# Patient Record
Sex: Male | Born: 1970 | Race: White | Hispanic: No | Smoking: Current every day smoker
Health system: Southern US, Community
[De-identification: ages and names within clinical notes are randomized; demographics above are authoritative.]

## PROBLEM LIST (undated history)

## (undated) DIAGNOSIS — T8859XA Other complications of anesthesia, initial encounter: Secondary | ICD-10-CM

## (undated) DIAGNOSIS — J189 Pneumonia, unspecified organism: Secondary | ICD-10-CM

## (undated) DIAGNOSIS — K760 Fatty (change of) liver, not elsewhere classified: Secondary | ICD-10-CM

## (undated) DIAGNOSIS — Z87442 Personal history of urinary calculi: Secondary | ICD-10-CM

## (undated) DIAGNOSIS — B20 Human immunodeficiency virus [HIV] disease: Secondary | ICD-10-CM

## (undated) DIAGNOSIS — Z21 Asymptomatic human immunodeficiency virus [HIV] infection status: Secondary | ICD-10-CM

## (undated) DIAGNOSIS — F419 Anxiety disorder, unspecified: Secondary | ICD-10-CM

## (undated) DIAGNOSIS — K219 Gastro-esophageal reflux disease without esophagitis: Secondary | ICD-10-CM

---

## 2002-01-29 ENCOUNTER — Encounter: Payer: Self-pay | Admitting: Emergency Medicine

## 2002-01-29 ENCOUNTER — Emergency Department (HOSPITAL_COMMUNITY): Admission: EM | Admit: 2002-01-29 | Discharge: 2002-01-29 | Payer: Self-pay | Admitting: Emergency Medicine

## 2004-04-04 ENCOUNTER — Emergency Department (HOSPITAL_COMMUNITY): Admission: EM | Admit: 2004-04-04 | Discharge: 2004-04-05 | Payer: Self-pay | Admitting: Emergency Medicine

## 2004-08-25 ENCOUNTER — Ambulatory Visit: Payer: Self-pay | Admitting: Internal Medicine

## 2004-08-29 ENCOUNTER — Ambulatory Visit: Payer: Self-pay | Admitting: *Deleted

## 2005-09-25 ENCOUNTER — Emergency Department (HOSPITAL_COMMUNITY): Admission: EM | Admit: 2005-09-25 | Discharge: 2005-09-25 | Payer: Self-pay | Admitting: Emergency Medicine

## 2005-09-27 ENCOUNTER — Ambulatory Visit: Payer: Self-pay | Admitting: Family Medicine

## 2005-10-04 ENCOUNTER — Ambulatory Visit: Payer: Self-pay | Admitting: Internal Medicine

## 2005-10-15 ENCOUNTER — Ambulatory Visit: Payer: Self-pay | Admitting: Internal Medicine

## 2006-01-28 ENCOUNTER — Ambulatory Visit: Payer: Self-pay | Admitting: Family Medicine

## 2006-02-14 ENCOUNTER — Ambulatory Visit: Payer: Self-pay | Admitting: Internal Medicine

## 2006-02-25 ENCOUNTER — Ambulatory Visit: Payer: Self-pay | Admitting: Internal Medicine

## 2006-10-01 ENCOUNTER — Emergency Department (HOSPITAL_COMMUNITY): Admission: EM | Admit: 2006-10-01 | Discharge: 2006-10-01 | Payer: Self-pay | Admitting: Emergency Medicine

## 2006-10-17 ENCOUNTER — Ambulatory Visit: Payer: Self-pay | Admitting: Internal Medicine

## 2006-11-07 ENCOUNTER — Ambulatory Visit: Payer: Self-pay | Admitting: Internal Medicine

## 2006-11-15 ENCOUNTER — Emergency Department (HOSPITAL_COMMUNITY): Admission: EM | Admit: 2006-11-15 | Discharge: 2006-11-15 | Payer: Self-pay | Admitting: Emergency Medicine

## 2007-11-15 ENCOUNTER — Emergency Department (HOSPITAL_COMMUNITY): Admission: EM | Admit: 2007-11-15 | Discharge: 2007-11-15 | Payer: Self-pay | Admitting: Emergency Medicine

## 2008-12-28 ENCOUNTER — Emergency Department (HOSPITAL_COMMUNITY): Admission: EM | Admit: 2008-12-28 | Discharge: 2008-12-28 | Payer: Self-pay | Admitting: Emergency Medicine

## 2009-10-13 ENCOUNTER — Emergency Department (HOSPITAL_COMMUNITY): Admission: EM | Admit: 2009-10-13 | Discharge: 2009-10-13 | Payer: Self-pay | Admitting: Emergency Medicine

## 2010-02-06 ENCOUNTER — Emergency Department (HOSPITAL_COMMUNITY): Admission: EM | Admit: 2010-02-06 | Discharge: 2010-02-07 | Payer: Self-pay | Admitting: Emergency Medicine

## 2010-02-08 ENCOUNTER — Ambulatory Visit: Payer: Self-pay | Admitting: Internal Medicine

## 2010-02-08 ENCOUNTER — Encounter: Payer: Self-pay | Admitting: Internal Medicine

## 2010-02-08 LAB — CONVERTED CEMR LAB
ALT: 9 units/L (ref 0–53)
AST: 13 units/L (ref 0–37)
Absolute CD4: 370 #/uL — ABNORMAL LOW (ref 381–1469)
Albumin: 4.2 g/dL (ref 3.5–5.2)
Alkaline Phosphatase: 87 units/L (ref 39–117)
BUN: 7 mg/dL (ref 6–23)
Basophils Absolute: 0 10*3/uL (ref 0.0–0.1)
Basophils Relative: 0 % (ref 0–1)
CD4 T Helper %: 22 % — ABNORMAL LOW (ref 32–62)
CO2: 25 meq/L (ref 19–32)
Calcium: 9.6 mg/dL (ref 8.4–10.5)
Chlamydia, Swab/Urine, PCR: NEGATIVE
Chloride: 103 meq/L (ref 96–112)
Cholesterol: 125 mg/dL (ref 0–200)
Creatinine, Ser: 0.81 mg/dL (ref 0.40–1.50)
Eosinophils Absolute: 0.1 10*3/uL (ref 0.0–0.7)
Eosinophils Relative: 2 % (ref 0–5)
GC Probe Amp, Urine: NEGATIVE
Glucose, Bld: 82 mg/dL (ref 70–99)
HCT: 46.8 % (ref 39.0–52.0)
HDL: 29 mg/dL — ABNORMAL LOW (ref >39)
HIV 1 RNA Quant: 13500 copies/mL — ABNORMAL HIGH (ref <48)
HIV-1 RNA Quant, Log: 4.13 — ABNORMAL HIGH (ref <1.68)
Hemoglobin: 15.7 g/dL (ref 13.0–17.0)
Hgb A1c MFr Bld: 5.6 % (ref 4.6–6.1)
LDL Cholesterol: 81 mg/dL (ref 0–99)
Lymphocytes Relative: 28 % (ref 12–46)
Lymphs Abs: 1.7 10*3/uL (ref 0.7–4.0)
MCHC: 33.5 g/dL (ref 30.0–36.0)
MCV: 91.1 fL (ref 78.0–100.0)
Monocytes Absolute: 0.4 10*3/uL (ref 0.1–1.0)
Monocytes Relative: 7 % (ref 3–12)
Neutro Abs: 3.7 10*3/uL (ref 1.7–7.7)
Neutrophils Relative %: 62 % (ref 43–77)
Platelets: 135 10*3/uL — ABNORMAL LOW (ref 150–400)
Potassium: 4.5 meq/L (ref 3.5–5.3)
RBC: 5.14 M/uL (ref 4.22–5.81)
RDW: 13.6 % (ref 11.5–15.5)
Sodium: 138 meq/L (ref 135–145)
Total Bilirubin: 0.4 mg/dL (ref 0.3–1.2)
Total CHOL/HDL Ratio: 4.3
Total Protein: 8.4 g/dL — ABNORMAL HIGH (ref 6.0–8.3)
Total lymphocyte count: 1680 cells/mcL (ref 700–3300)
Triglycerides: 77 mg/dL (ref <150)
VLDL: 15 mg/dL (ref 0–40)
WBC: 6 10*3/uL (ref 4.0–10.5)

## 2010-02-09 ENCOUNTER — Encounter: Payer: Self-pay | Admitting: Internal Medicine

## 2010-02-09 ENCOUNTER — Ambulatory Visit: Payer: Self-pay | Admitting: Internal Medicine

## 2010-02-09 DIAGNOSIS — L301 Dyshidrosis [pompholyx]: Secondary | ICD-10-CM

## 2010-02-09 DIAGNOSIS — R21 Rash and other nonspecific skin eruption: Secondary | ICD-10-CM

## 2010-02-09 DIAGNOSIS — B20 Human immunodeficiency virus [HIV] disease: Secondary | ICD-10-CM | POA: Insufficient documentation

## 2010-02-23 ENCOUNTER — Encounter: Payer: Self-pay | Admitting: Internal Medicine

## 2010-02-23 ENCOUNTER — Ambulatory Visit: Payer: Self-pay | Admitting: Internal Medicine

## 2010-02-23 DIAGNOSIS — F17200 Nicotine dependence, unspecified, uncomplicated: Secondary | ICD-10-CM | POA: Insufficient documentation

## 2010-05-23 ENCOUNTER — Encounter: Payer: Self-pay | Admitting: Internal Medicine

## 2010-09-05 ENCOUNTER — Ambulatory Visit: Payer: Self-pay | Admitting: Internal Medicine

## 2010-09-05 LAB — CONVERTED CEMR LAB
ALT: 9 units/L (ref 0–53)
AST: 17 units/L (ref 0–37)
Albumin: 3.9 g/dL (ref 3.5–5.2)
Alkaline Phosphatase: 85 units/L (ref 39–117)
BUN: 7 mg/dL (ref 6–23)
Basophils Absolute: 0 10*3/uL (ref 0.0–0.1)
Basophils Relative: 0 % (ref 0–1)
CO2: 28 meq/L (ref 19–32)
Calcium: 9 mg/dL (ref 8.4–10.5)
Chloride: 102 meq/L (ref 96–112)
Creatinine, Ser: 0.88 mg/dL (ref 0.40–1.50)
Eosinophils Absolute: 0.2 10*3/uL (ref 0.0–0.7)
Eosinophils Relative: 2 % (ref 0–5)
Glucose, Bld: 133 mg/dL — ABNORMAL HIGH (ref 70–99)
HCT: 45.9 % (ref 39.0–52.0)
HIV 1 RNA Quant: 36300 copies/mL — ABNORMAL HIGH (ref <20)
HIV-1 RNA Quant, Log: 4.56 — ABNORMAL HIGH (ref <1.30)
Hemoglobin: 14.9 g/dL (ref 13.0–17.0)
Lymphocytes Relative: 22 % (ref 12–46)
Lymphs Abs: 1.8 10*3/uL (ref 0.7–4.0)
MCHC: 32.5 g/dL (ref 30.0–36.0)
MCV: 94.3 fL (ref 78.0–100.0)
Monocytes Absolute: 0.4 10*3/uL (ref 0.1–1.0)
Monocytes Relative: 5 % (ref 3–12)
Neutro Abs: 5.6 10*3/uL (ref 1.7–7.7)
Neutrophils Relative %: 70 % (ref 43–77)
Platelets: 193 10*3/uL (ref 150–400)
Potassium: 4.1 meq/L (ref 3.5–5.3)
RBC: 4.87 M/uL (ref 4.22–5.81)
RDW: 15.1 % (ref 11.5–15.5)
Sodium: 137 meq/L (ref 135–145)
Total Bilirubin: 0.6 mg/dL (ref 0.3–1.2)
Total Protein: 8 g/dL (ref 6.0–8.3)
WBC: 8 10*3/uL (ref 4.0–10.5)

## 2010-09-18 ENCOUNTER — Ambulatory Visit: Payer: Self-pay | Admitting: Internal Medicine

## 2010-09-18 DIAGNOSIS — J029 Acute pharyngitis, unspecified: Secondary | ICD-10-CM | POA: Insufficient documentation

## 2010-09-18 LAB — CONVERTED CEMR LAB

## 2010-09-19 ENCOUNTER — Telehealth (INDEPENDENT_AMBULATORY_CARE_PROVIDER_SITE_OTHER): Payer: Self-pay | Admitting: *Deleted

## 2010-09-25 ENCOUNTER — Encounter: Payer: Self-pay | Admitting: Internal Medicine

## 2010-10-08 ENCOUNTER — Emergency Department (HOSPITAL_COMMUNITY): Admission: EM | Admit: 2010-10-08 | Discharge: 2010-10-08 | Payer: Self-pay | Admitting: Emergency Medicine

## 2010-10-09 ENCOUNTER — Ambulatory Visit: Payer: Self-pay | Admitting: Internal Medicine

## 2010-10-27 ENCOUNTER — Telehealth (INDEPENDENT_AMBULATORY_CARE_PROVIDER_SITE_OTHER): Payer: Self-pay | Admitting: *Deleted

## 2010-11-20 ENCOUNTER — Encounter (INDEPENDENT_AMBULATORY_CARE_PROVIDER_SITE_OTHER): Payer: Self-pay | Admitting: *Deleted

## 2011-01-08 ENCOUNTER — Encounter: Payer: Self-pay | Admitting: Internal Medicine

## 2011-01-23 NOTE — Progress Notes (Signed)
Summary: unable to pick up RX  Phone Note Call from Patient   Caller: Patient Summary of Call: Pt. called he is unable to pick up the Erythromycin because it is not on the $4.00 list at [facility].  He said he is feeling better and will call if he needs something else called in. Initial call taken by: Wendall Mola CMA Duncan Dull),  September 19, 2010 12:21 PM

## 2011-01-23 NOTE — Miscellaneous (Signed)
Summary: RW Financial Update Timothy Salas)  Clinical Lists Changes  Observations: Added new observation of RWTITLE: B (11/20/2010 16:04) Added new observation of AIDSDAP: Yes 2011 (11/20/2010 16:04) Added new observation of PCTFPL: 0  (11/20/2010 16:04) Added new observation of INCOMESOURCE: None  (11/20/2010 16:04) Added new observation of HOUSEINCOME: 0  (11/20/2010 16:04) Added new observation of FAMILYSIZE: 1  (11/20/2010 16:04) Added new observation of HOUSING: Stable/permanent  (11/20/2010 16:04) Added new observation of FINASSESSDT: 09/18/2010  (11/20/2010 16:04) Added new observation of YEARLYEXPEN: 0  (11/20/2010 16:04)

## 2011-01-23 NOTE — Miscellaneous (Signed)
Summary: intake  Clinical Lists Changes  Orders: Added new Service order of Influenza Vaccine NON MCR (16109) - Signed Observations: Added new observation of #CHILD<18 IN: No (02/08/2010 10:29) Added new observation of PAYOR: No Insurance (02/08/2010 10:29) Added new observation of AIDSDAP: Pending (02/08/2010 10:29) Added new observation of DATE1STVISIT: 02/09/2010 (02/08/2010 10:29) Added new observation of INFECTDIS MD: Philipp Deputy (02/08/2010 10:29) Added new observation of HIV INIT DX: 1988  (02/08/2010 10:29) Added new observation of PNEUMOVAX: Pneumovax  (02/08/2010 10:29) Added new observation of FLU VAX#1VIS: 07/17/07 version given February 09, 2010.  (02/08/2010 10:29) Added new observation of FLU VAXLOT: [id number]  (02/08/2010 10:29) Added new observation of FLU VAX EXP: 06/22/2010  (02/08/2010 10:29) Added new observation of FLU VAXBY: Sharen Heck RN  (02/08/2010 10:29) Added new observation of FLU VAXRTE: IM  (02/08/2010 10:29) Added new observation of FLU VAX DSE: 0.5 ml  (02/08/2010 10:29) Added new observation of FLU VAXMFR: [employer] P[employer]  (02/08/2010 10:29) Added new observation of FLU VAX SITE: right deltoid  (02/08/2010 10:29) Added new observation of FLU VAX: Fluvax Non-MCR  (02/08/2010 10:29) Added new observation of DRUG USE: Yes  (02/08/2010 10:29) Added new observation of ALCOHOL USE: occasional /day (02/08/2010 10:29) Added new observation of ALCOHOL COMM: Yes  (02/08/2010 10:29) Added new observation of HIV RISK BEH: MSM  (02/08/2010 10:29) Added new observation of GENDER: Male  (02/08/2010 10:29) Added new observation of MARITAL STAT: Single  (02/08/2010 10:29) Added new observation of LATINO/HISP: Yes  (02/08/2010 10:29) Added new observation of RACE: White  (02/08/2010 10:29) Added new observation of REC_MESSAGE: Yes  (02/08/2010 10:29) Added new observation of RECPHONECALL: Yes  (02/08/2010 10:29) Added new observation of RW VITAL STA: Active  (02/08/2010  10:29) Added new observation of PATNTCOUNTY: [place]  (02/08/2010 10:29) Added new observation of RWPARTICIP: Yes - [facility]  (02/08/2010 10:29)      Orders Added: 1)  Influenza Vaccine NON MCR [00028]      Infectious Disease New Patient Intake  Health Insurance / Payor: No Insurance Employer: [employer]   Does insurance cover prescriptions? No Our patient has been informed that medication assistance programs are available.  Our Co-ordinator will be meeting with the patient during this visit to discuss financial and medication assistance.   Do you have a Primary physician: No Physician Name: Dr.Zlaty Alexa   City/State: [place] Dates of Service To:   05/16/2003 Are family members aware of patient's diagnosis?  If so, are they supportive? yes, lives with grandmother     Same partner for 8 years Describe patient's current social support (family, friends, support groups): Reports everyone knows he is HIV positive.  Medical History Medical Problems OTHER than HIV: No   Medication Allergies: Yes   Medications: No    Family History Heart Disease: Yes  Family Side: Paternal  Comments: father died MI May 15, 2002; p-grandmother cabgx3 Hypertension: No Diabetes: Yes  Family Side: Maternal, Paternal  Comments: both grandmothers and father High Cholesterol: No  Comments: father Kidney Disease: No Cancer: No Thyroid Disease: No   Behavioral Health Assessment Have you ever been diagnosed with depression or mental illness? Yes  Diagnosis: depression Do you drink alcohol? Yes Last Date of Consumption: 02/08/2010 Frequency: occasional Alcohol Beverage Type(s): wine or rum Do you use recreational drugs? Yes Drugs: daily MJ use, smokes 2-4 joints/day Do you feel you have a problem with drugs and/or alcohol? No   Have you ever been in a treatment facility for any addiction? No If you are currently using drugs  and/or alcohol, would you like help for this addiction? No Behavioral Health Comments: hx  overdose as a teen  HIV Intake Information When did you first test positive for HIV? 1988 Where was this test performed?  Name of Agency: [place]  City/State: [place] [facility]: [place] Was this your first time ever being tested or HIV? Yes Risk Factor(s) for HIV: MSM  Method of Exposure to HIV: Transfusion   Have you ever been hospitalized for any HIV-related condition? No  Have you ever been under the care of a physician for being HIV positive? Yes Name of Physician 1: Dr. Philipp Deputy  City/State: [place], [place]  HIV Medications Information The patient is currently NOT taking any HIV medications.  Nucleoside Reverse Transcriptase Inhibitors (NRTI's) Combivir (Lamivadin 150mg /Zidovudine 300mg ): Yes    Sexual History Are you in a current relationship? Yes How long have you been in this relationship? 8 years Are they aware of your diagnosis? Yes Have they been tested for HIV? Yes What were the results: Negative Are you currently sexually active? Yes  Evaluation and Follow-Up  ADAP: Pending   Pneumovax Immunization History:    Pneumovax # 1:  Pneumovax (02/08/2010)  Influenza Vaccine    Vaccine Type: Fluvax Non-MCR    Site: right deltoid    Mfr: A[employer]is Pasteur    Dose: 0.5 ml    Route: IM    Given by: Sharen Heck RN    Exp. Date: 06/22/2010    Lot #: [id number]    VIS given: 07/17/07 version given February 09, 2010.  Flu Vaccine Consent Questions    Do you have a history of severe allergic reactions to this vaccine? no    Any prior history of allergic reactions to egg and/or gelatin? no    Do you have a sensitivity to the preservative Thimersol? no    Do you have a past history of Guillan-Barre Syndrome? no    Do you currently have an acute febrile illness? no    Have you ever had a severe reaction to latex? no    Vaccine information given and explained to patient? yes Flu and pneumovax given 02/08/10.  Sharen Heck RN Pneumovax 0.5 ml given at  left deltoid. Lot #[id number] exp. 28 Jan.12/[employer]

## 2011-01-23 NOTE — Miscellaneous (Signed)
Summary: RW Update  Clinical Lists Changes  Observations: Added new observation of DATE1STVISIT: 09/18/2010 (09/25/2010 16:09) Added new observation of RWPARTICIP: Yes (09/25/2010 16:09)

## 2011-01-23 NOTE — Miscellaneous (Signed)
Summary: Office Visit ([facility])    Vital Signs:  Patient profile:   40 year old male Height:      65 inches Weight:      132 pounds Temp:     97.9 degrees F oral Pulse rate:   90 / minute Pulse rhythm:   regular Resp:     17 per minute BP sitting:   126 / 72  (left arm)  Vitals Entered By: Sharen Heck RN (February 09, 2010 9:31 AM) CC: 05 pateint c/o rash on facewhen shaving/no care forseveral years Is Patient Diabetic? No  Does patient need assistance? Functional Status Self care Ambulation Normal   CC:  05 pateint c/o rash on facewhen shaving/no care forseveral years.  History of Present Illness: Pt has been out of care for greater than 3 years.  He c/o rash on face where his beard is. Needs a note for work to state that he should not shave frequently because it makes his rash worse. He also has a puritic rash on his hands.   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 Take 1 Tablet By Mouth Two Times A Day 2)  Triamcinolone Acetonide 0.1 % Crea (Triamcinolone Acetonide) .... Apply Two Times A Day 3)  Fluconazole 100 Mg Tabs (Fluconazole) .... Take 1 Tablet By Mouth Once A Day  Allergies (verified): 1)  ! Amoxicillin  Past History:  Past Medical History: HIV disease   Review of Systems  The patient denies anorexia, fever, and weight loss.     Physical Exam  General:  alert,  well-developed, well-nourished, and well-hydrated.   Head:  normocephalic and atraumatic.   folliculits on his face in area of his beard Mouth:  pharynx pink and moist.   Lungs:  normal breath sounds.     Impression & Recommendations:  Problem # 1:  FACIAL RASH (ICD-782.1) will treat with doxy letter written His updated medication list for this problem includes:    Triamcinolone Acetonide 0.1 % Crea (Triamcinolone acetonide) .Marland Kitchen... Apply two times a day  Problem # 2:  DYSHIDROTIC ECZEMA, HANDS (ICD-705.81) triamcinolone cream  Problem # 3:  HIV DISEASE (ICD-042) Currently not on therapy.  Labs pending. Will have pt return in 2 weeks. Orders: Est. Patient Level III (16109)  Medications Added to Medication List This Visit: 1)  Doxycycline Hyclate 100 Mg Caps (Doxycycline hyclate) .... Take 1 tablet by mouth two times a day 2)  Triamcinolone Acetonide 0.1 % Crea (Triamcinolone acetonide) .... Apply two times a day 3)  Fluconazole 100 Mg Tabs (Fluconazole) .... Take 1 tablet by mouth once a day   Patient Instructions: 1)  Please schedule a follow-up appointment in 2 weeks. Prescriptions: FLUCONAZOLE 100 MG TABS (FLUCONAZOLE) Take 1 tablet by mouth once a day  #10 x 0   Entered and Authorized by:  Yisroel Ramming MD   Signed by:   Yisroel Ramming MD on 02/09/2010   Method used:   Print then Give to Patient   RxID:   [id number] TRIAMCINOLONE ACETONIDE 0.1 % CREA (TRIAMCINOLONE ACETONIDE) apply two times a day  #30gm x 3   Entered and Authorized by:   Yisroel Ramming MD   Signed by:   Yisroel Ramming MD on 02/09/2010   Method used:   Print then Give to Patient   RxID:   [id number] DOXYCYCLINE HYCLATE 100 MG CAPS (DOXYCYCLINE HYCLATE) Take 1 tablet by mouth two times a day  #20 x 0   Entered and Authorized by:   Yisroel Ramming MD   Signed by:   Yisroel Ramming MD on 02/09/2010   Method used:   Print then Give to Patient   RxID:   [id number]

## 2011-01-23 NOTE — Miscellaneous (Signed)
Summary:  Lab orders  Clinical Lists Changes  Orders: Added new Test order of T-CBC w/Diff [phone]) - Signed Added new Test order of T-CD4SP [facility]) (CD4SP) - Signed Added new Test order of T-Comprehensive Metabolic Panel [id number]) - Signed Added new Test order of T-HIV Viral Load [phone]) - Signed     Process Orders Check Orders Results:     [facility]: ABN not required for this insurance Tests Sent for requisitioning (May 23, 2010 2:26 PM):     05/24/2010: [facility] -- T-CBC w/Diff [[id number]] (signed)     05/24/2010: [facility] -- T-Comprehensive Metabolic Panel [[id number]] (signed)     05/24/2010: [facility] -- T-HIV Viral Load [phone] (signed)

## 2011-01-23 NOTE — Miscellaneous (Signed)
Summary: Office Visit ([facility])    Vital Signs:  Patient profile:   40 year old male Height:      65 inches Weight:      132 pounds BMI:     22.05 Temp:     97.7 degrees F oral Pulse rate:   84 / minute Pulse rhythm:   regular Resp:     20 per minute BP sitting:   116 / 71  (left arm) Cuff size:   regular  Vitals Entered By: Michelle Nasuti (February 23, 2010 9:35 AM) CC: 05 follow up review lab results. pt is interested in smoking cessation Is Patient Diabetic? No Pain Assessment Patient in pain? no       Does patient need assistance? Functional Status Self care Ambulation Normal   CC:  05 follow up review lab results. pt is interested in smoking cessation.  History of Present Illness: Pt states that he feels better.  He is here for lab results.  He would like to quit smoking.  He has been smoking since he was 11.  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 Apply Two Times A Day 2)  Chantix Starting Month Pak 0.5 Mg X 11 & 1 Mg X 42 Tabs (Varenicline Tartrate) .... Take As Directed  Allergies: 1)  ! Amoxicillin   Review of Systems  The patient denies anorexia, fever, and weight loss.     Physical Exam  General:  alert, well-developed, well-nourished, and well-hydrated.   Head:  normocephalic and atraumatic.   Mouth:  pharynx pink and moist.  no thrush  Lungs:  normal breath sounds.     Impression & Recommendations:  Problem # 1:  HIV DISEASE (ICD-042) His CD4ct was 370 and VL 13,500. Pt does not wish to be on meds at this time.  He will return in 3 months for repeat labs. Diagnostics Reviewed:  WBC: 6.0 (02/08/2010)   Hgb: 15.7 (02/08/2010)   HCT:  46.8 (02/08/2010)   Platelets: 135 (02/08/2010) HIV genotype: See Comment (02/08/2010)   HIV-1 RNA: 13500 (02/08/2010)     Problem # 2:  TOBACCO USER (ICD-305.1) He wishes to quit.  Will try with chantix.  Potential side effects were discussed. His updated medication list for this problem includes:    Chantix Starting Month Pak 0.5 Mg X 11 & 1 Mg X 42 Tabs (Varenicline tartrate) .Marland Kitchen... Take as directed  Medications Added to Medication List This Visit: 1)  Chantix Starting Month Pak 0.5 Mg X 11 & 1 Mg X 42 Tabs (Varenicline tartrate) .... Take as directed   Patient Instructions: 1)  Please schedule a follow-up appointment in 3 months. Prescriptions: CHANTIX STARTING MONTH PAK 0.5 MG X 11 & 1 MG X 42 TABS (VARENICLINE TARTRATE) take as directed  #1 pack x 0   Entered and Authorized by:   Yisroel Ramming MD   Signed by:   Yisroel Ramming MD on 02/23/2010   Method used:   Print then Give to Patient   RxID:   [id number]

## 2011-01-23 NOTE — Progress Notes (Signed)
Summary: [facility] rxes arrived  Phone Note Outgoing Call   Call placed by: Jennet Maduro RN,  October 27, 2010 2:17 PM Call placed to: Patient Action Taken: Assistance medications ready for pick up Summary of Call: [facility] rxes have arrived.  [place].   Spoke with the pt. Jennet Maduro RN  October 27, 2010 2:19 PM

## 2011-01-23 NOTE — Progress Notes (Signed)
Summary: Chantix pt. assistance application  Phone Note Outgoing Call   Call placed by: Annice Pih Summary of Call: [employer] to care application mailed with RX and Chief Financial Officer. Initial call taken by: Wendall Mola CMA Duncan Dull),  October 27, 2010 4:29 PM

## 2011-01-23 NOTE — Assessment & Plan Note (Signed)
Summary: f/u [mkj]   CC:  follow-up visit, lab results, c/o headache, sorethroat, chills, and night sweats x 3 days.  History of Present Illness: Pt c/o 4 days of sore throat, night sweats and chills.  He also c/o a HA.  No stiff neck or cough.  He never got his chantix filled and is till smoking so would like to get it.  Preventive Screening-Counseling & Management  Alcohol-Tobacco     Alcohol drinks/day: <1     Smoking Status: current     Packs/Day: 1.0     Year Started: 1987  Caffeine-Diet-Exercise     Caffeine use/day: 6 glasses     Caffeine Counseling: decrease use of caffeine     Does Patient Exercise: yes     Type of exercise: plays with dogs     Exercise (avg: min/session): <30     Times/week: 3  Safety-Violence-Falls     Seat Belt Use: yes      Sexual History:  currently monogamous.        Drug Use:  current, marijuana, and Yes.    Comments: pt. declined condoms   Updated Prior Medication List: TRIAMCINOLONE ACETONIDE 0.1 % CREA (TRIAMCINOLONE ACETONIDE) apply two times a day CHANTIX STARTING MONTH PAK 0.5 MG X 11 & 1 MG X 42 TABS (VARENICLINE TARTRATE) take as directed ERY-TAB 333 MG TBEC (ERYTHROMYCIN BASE) Take 1 tablet by mouth three times a day  Current Allergies (reviewed today): ! AMOXICILLIN Past History:  Past Medical History: Last updated: 02/09/2010 HIV disease  Social History: Drug Use:  current, marijuana, Yes Sexual History:  currently monogamous  Review of Systems  The patient denies anorexia, weight loss, prolonged cough, and hemoptysis.    Vital Signs:  Patient profile:   40 year old male Height:      65 inches (165.10 cm) Weight:      130.8 pounds (59.45 kg) BMI:     21.84 Temp:     97.9 degrees F (36.61 degrees C) oral Pulse rate:   69 / minute BP sitting:   123 / 82  (right arm)  Vitals Entered By: Wendall Mola CMA Duncan Dull) (September 18, 2010 10:12 AM) CC: follow-up visit, lab results, c/o headache, sorethroat,  chills, night sweats x 3 days Is Patient Diabetic? No Pain Assessment Patient in pain? yes     Location: head Intensity: 4 Type: aching Onset of pain  Constant Nutritional Status BMI of 19 -24 = normal Nutritional Status Detail appetite "good"  Does patient need assistance? Functional Status Self care Ambulation Normal   Physical Exam  General:  alert, well-developed, well-nourished, and well-hydrated.   Head:  normocephalic and atraumatic.   Mouth:  pharynx pink and moist, no erythema, and no exudates.   Neck:  supple.   Lungs:  normal breath sounds.     Impression & Recommendations:  Problem # 1:  HIV DISEASE (ICD-042) Will obtain a genotype and have pt f/u in 3 weeks. He is considering starting therapy. Orders: T-HIV Genotype ([id number]) Est. Patient Level III (09811)  Diagnostics Reviewed:  CD4: 330 (09/06/2010)   WBC: 8.0 (09/05/2010)   Hgb: 14.9 (09/05/2010)   HCT: 45.9 (09/05/2010)   Platelets: 193 (09/05/2010) HIV genotype: See Comment (02/08/2010)   HIV-1 RNA: 36300 (09/05/2010)     Problem # 2:  TOBACCO USER (ICD-305.1) will try chantix His updated medication list for this problem includes:    Chantix Starting Month Pak 0.5 Mg X 11 & 1 Mg X 42 Tabs (  Varenicline tartrate) .Marland Kitchen... Take as directed  Problem # 3:  PHARYNGITIS (ICD-462)  His updated medication list for this problem includes:    Ery-tab 333 Mg Tbec (Erythromycin base) .Marland Kitchen... Take 1 tablet by mouth three times a day  Medications Added to Medication List This Visit: 1)  Ery-tab 333 Mg Tbec (Erythromycin base) .... Take 1 tablet by mouth three times a day  Patient Instructions: 1)  Please schedule a follow-up appointment in 3 weeks. Prescriptions: ERY-TAB 333 MG TBEC (ERYTHROMYCIN BASE) Take 1 tablet by mouth three times a day  #30 x 0   Entered and Authorized by:   Yisroel Ramming MD   Signed by:   Yisroel Ramming MD on 09/18/2010   Method used:   Print then Give to Patient   RxID:    [id number]   Appended Document: Orders Update    Clinical Lists Changes  Orders: Added new Service order of Influenza Vaccine NON MCR (14782) - Signed Observations: Added new observation of FLU VAX#1VIS: 07/18/10 version given September 18, 2010. (09/18/2010 10:56) Added new observation of FLU VAXLOT: 1103 3P (09/18/2010 10:56) Added new observation of FLU VAX EXP: 03/25/2011 (09/18/2010 10:56) Added new observation of FLU VAXBY: Wendall Mola CMA ( AAMA) (09/18/2010 10:56) Added new observation of FLU VAXRTE: IM (09/18/2010 10:56) Added new observation of FLU VAX DSE: 0.5 ml (09/18/2010 10:56) Added new observation of FLU VAXMFR: [employer]is (09/18/2010 10:56) Added new observation of FLU VAX SITE: right deltoid (09/18/2010 10:56) Added new observation of FLU VAX: Fluvax Non-MCR (09/18/2010 10:56)       Immunizations Administered:  Influenza Vaccine # 1:    Vaccine Type: Fluvax Non-MCR    Site: right deltoid    Mfr: [employer]is    Dose: 0.5 ml    Route: IM    Given by: Wendall Mola CMA ( AAMA)    Exp. Date: 03/25/2011    Lot #: [id number]    VIS given: 07/18/10 version given September 18, 2010.  Flu Vaccine Consent Questions:    Do you have a history of severe allergic reactions to this vaccine? no    Any prior history of allergic reactions to egg and/or gelatin? no    Do you have a sensitivity to the preservative Thimersol? no    Do you have a past history of Guillan-Barre Syndrome? no    Do you currently have an acute febrile illness? no    Have you ever had a severe reaction to latex? no    Vaccine information given and explained to patient? yes

## 2011-01-23 NOTE — Assessment & Plan Note (Signed)
Summary: 3WK F/U [MKJ]   CC:  follow-up visit, lab results, and pt. seen at ER 10/08/10 for tooth pain.  History of Present Illness: Pt developed tooth pain and went tothe [facility] this [facility].  He got a Rx for clindamycin and norco.  It feels somewhat better. He is here to get the results of his genotype so he can start meds.  Preventive Screening-Counseling & Management  Alcohol-Tobacco     Alcohol drinks/day: <1     Smoking Status: current     Packs/Day: <0.25     Year Started: 1987  Caffeine-Diet-Exercise     Caffeine use/day: 6 glasses     Caffeine Counseling: decrease use of caffeine     Does Patient Exercise: yes     Type of exercise: plays with dogs     Exercise (avg: min/session): <30     Times/week: 3  Safety-Violence-Falls     Seat Belt Use: yes      Sexual History:  currently monogamous.        Drug Use:  current, marijuana, and Yes.    Comments: pt. given condoms   Updated Prior Medication List: TRIAMCINOLONE ACETONIDE 0.1 % CREA (TRIAMCINOLONE ACETONIDE) apply two times a day CHANTIX STARTING MONTH PAK 0.5 MG X 11 & 1 MG X 42 TABS (VARENICLINE TARTRATE) take as directed CLINDAMYCIN HCL 300 MG CAPS (CLINDAMYCIN HCL) take one capsule one time a day for 7 days NORCO 5-325 MG TABS (HYDROCODONE-ACETAMINOPHEN) one to two tablet every 6 hrs. ATRIPLA 600-200-300 MG TABS (EFAVIRENZ-EMTRICITAB-TENOFOVIR) Take 1 tablet by mouth at bedtime  Current Allergies (reviewed today): ! AMOXICILLIN Review of Systems  The patient denies anorexia, fever, weight loss, prolonged cough, and headaches.    Vital Signs:  Patient profile:   40 year old male Height:      65 inches (165.10 cm) Weight:      133.12 pounds (60.51 kg) BMI:     22.23 Temp:     98.0 degrees F (36.67 degrees C) oral Pulse rate:   78 / minute BP sitting:   114 / 72  (right arm)  Vitals Entered By: Wendall Mola CMA Duncan Dull) (October 09, 2010 10:11 AM) CC: follow-up visit, lab results, pt. seen at ER  10/08/10 for tooth pain Is Patient Diabetic? No Pain Assessment Patient in pain? no      Nutritional Status BMI of 19 -24 = normal Nutritional Status Detail appetite "good"  Does patient need assistance? Functional Status Self care Ambulation Normal   Physical Exam  General:  alert, well-developed, well-nourished, and well-hydrated.   Head:  normocephalic and atraumatic.   Mouth:  fair dentition.   Lungs:  normal breath sounds.      Impression & Recommendations:  Problem # 1:  HIV DISEASE (ICD-042) Discussed treatment options. Pt will start Atripla. Potential side effects discussed. Will refer for enrollment in ADAP. Pt will return 6 weeks after starting Atripl for labs. Orders: Est. Patient Level IV (16073)  Diagnostics Reviewed:  CD4: 330 (09/06/2010)   WBC: 8.0 (09/05/2010)   Hgb: 14.9 (09/05/2010)   HCT: 45.9 (09/05/2010)   Platelets: 193 (09/05/2010) HIV genotype: See Comment (09/18/2010)   HIV-1 RNA: 36300 (09/05/2010)     Medications Added to Medication List This Visit: 1)  Clindamycin Hcl 300 Mg Caps (Clindamycin hcl) .... Take one capsule one time a day for 7 days 2)  Norco 5-325 Mg Tabs (Hydrocodone-acetaminophen) .... One to two tablet every 6 hrs. 3)  Atripla 600-200-300 Mg Tabs (Efavirenz-emtricitab-tenofovir) .Marland KitchenMarland KitchenMarland Kitchen  Take 1 tablet by mouth at bedtime  Other Orders: Future Orders: T-CD4SP ([facility]) (CD4SP) ... 11/20/2010 T-HIV Viral Load [id number]) ... 11/20/2010 T-Comprehensive Metabolic Panel [id number]) ... 11/20/2010 T-CBC w/Diff ([id number]) ... 11/20/2010  Patient Instructions: 1)  Please schedule a follow-up appointment in 8 weeks, 2 weeks after labs.  Prescriptions: ATRIPLA 600-200-300 MG TABS (EFAVIRENZ-EMTRICITAB-TENOFOVIR) Take 1 tablet by mouth at bedtime  #30 x 5   Entered and Authorized by:   Yisroel Ramming MD   Signed by:   Yisroel Ramming MD on 10/09/2010   Method used:   Print then Give to Patient   RxID:   [id number] NORCO  5-325 MG TABS (HYDROCODONE-ACETAMINOPHEN) one to two tablet every 6 hrs.  #10 x 0   Entered by:   Wendall Mola CMA ( AAMA)   Authorized by:   Yisroel Ramming MD   Signed by:   Wendall Mola CMA ( AAMA) on 10/09/2010   Method used:   Print then Give to Patient   RxID:   [id number]  RX printed in error Wendall Mola CMA ( AAMA)  October 09, 2010 10:20 AM

## 2011-01-25 NOTE — Miscellaneous (Signed)
Summary: [employer].  [employer].   Imported By: Florinda Marker 01/08/2011 14:34:33  _____________________________________________________________________  External Attachment:    Type:   Image     Comment:   External Document

## 2011-02-05 ENCOUNTER — Ambulatory Visit (INDEPENDENT_AMBULATORY_CARE_PROVIDER_SITE_OTHER): Payer: Self-pay | Admitting: Adult Health

## 2011-02-05 ENCOUNTER — Encounter: Payer: Self-pay | Admitting: Adult Health

## 2011-02-05 ENCOUNTER — Other Ambulatory Visit: Payer: Self-pay | Admitting: Adult Health

## 2011-02-05 DIAGNOSIS — K069 Disorder of gingiva and edentulous alveolar ridge, unspecified: Secondary | ICD-10-CM

## 2011-02-05 DIAGNOSIS — B20 Human immunodeficiency virus [HIV] disease: Secondary | ICD-10-CM

## 2011-02-05 DIAGNOSIS — B370 Candidal stomatitis: Secondary | ICD-10-CM

## 2011-02-05 DIAGNOSIS — K056 Periodontal disease, unspecified: Secondary | ICD-10-CM | POA: Insufficient documentation

## 2011-02-05 LAB — CONVERTED CEMR LAB
Albumin: 4 g/dL (ref 3.5–5.2)
Alkaline Phosphatase: 84 units/L (ref 39–117)
BUN: 8 mg/dL (ref 6–23)
CO2: 29 meq/L (ref 19–32)
Calcium: 9.5 mg/dL (ref 8.4–10.5)
Chloride: 104 meq/L (ref 96–112)
Glucose, Bld: 92 mg/dL (ref 70–99)
HIV-1 RNA Quant, Log: 2.19 — ABNORMAL HIGH (ref <1.30)
Hemoglobin: 13.2 g/dL (ref 13.0–17.0)
Lymphocytes Relative: 40 % (ref 12–46)
Lymphs Abs: 1.8 10*3/uL (ref 0.7–4.0)
Monocytes Absolute: 0.4 10*3/uL (ref 0.1–1.0)
Monocytes Relative: 9 % (ref 3–12)
Neutro Abs: 2 10*3/uL (ref 1.7–7.7)
Neutrophils Relative %: 46 % (ref 43–77)
Potassium: 5 meq/L (ref 3.5–5.3)
RBC: 4.3 M/uL (ref 4.22–5.81)
WBC: 4.4 10*3/uL (ref 4.0–10.5)

## 2011-02-06 LAB — T-HELPER CELL (CD4) - (RCID CLINIC ONLY)
CD4 % Helper T Cell: 23 % — ABNORMAL LOW (ref 33–55)
CD4 T Cell Abs: 440 uL (ref 400–2700)

## 2011-02-19 ENCOUNTER — Encounter (INDEPENDENT_AMBULATORY_CARE_PROVIDER_SITE_OTHER): Payer: Self-pay | Admitting: *Deleted

## 2011-02-19 ENCOUNTER — Ambulatory Visit: Payer: Self-pay | Admitting: Adult Health

## 2011-02-20 ENCOUNTER — Encounter (INDEPENDENT_AMBULATORY_CARE_PROVIDER_SITE_OTHER): Payer: Self-pay | Admitting: *Deleted

## 2011-02-26 ENCOUNTER — Ambulatory Visit: Payer: Self-pay | Admitting: Adult Health

## 2011-03-01 NOTE — Miscellaneous (Signed)
Summary: adap UPDATE  Clinical Lists Changes  Observations: Added new observation of FINASSESSDT: 02/19/2011 (02/20/2011 10:51)

## 2011-03-01 NOTE — Miscellaneous (Signed)
  Clinical Lists Changes  Observations: Added new observation of AIDSDAP: Pending APPROVAL 2012 (02/19/2011 15:51)

## 2011-03-05 ENCOUNTER — Encounter (INDEPENDENT_AMBULATORY_CARE_PROVIDER_SITE_OTHER): Payer: Self-pay | Admitting: *Deleted

## 2011-03-05 ENCOUNTER — Ambulatory Visit (INDEPENDENT_AMBULATORY_CARE_PROVIDER_SITE_OTHER): Payer: Self-pay | Admitting: Adult Health

## 2011-03-05 ENCOUNTER — Encounter: Payer: Self-pay | Admitting: Adult Health

## 2011-03-05 DIAGNOSIS — K5289 Other specified noninfective gastroenteritis and colitis: Secondary | ICD-10-CM | POA: Insufficient documentation

## 2011-03-05 DIAGNOSIS — B20 Human immunodeficiency virus [HIV] disease: Secondary | ICD-10-CM

## 2011-03-08 LAB — T-HELPER CELL (CD4) - (RCID CLINIC ONLY)
CD4 % Helper T Cell: 21 % — ABNORMAL LOW (ref 33–55)
CD4 T Cell Abs: 330 uL — ABNORMAL LOW (ref 400–2700)

## 2011-03-13 NOTE — Assessment & Plan Note (Signed)
Summary: F/U OV/VS   Vital Signs:  Patient profile:   40 year old male Height:      65 inches Weight:      139 pounds BMI:     23.21 Temp:     98 degrees F oral Pulse rate:   89 / minute BP sitting:   127 / 82  (right arm)  Vitals Entered By: Alesia Morin CMA (February 05, 2011 10:15 AM) CC: follow-up visit for labs, pt just started [place] and thinks he needs labs he is concerned about if it is working he needs refills on med Is Patient Diabetic? No Pain Assessment Patient in pain? no      Nutritional Status BMI of 19 -24 = normal Nutritional Status Detail  appetite "good"  Have you ever been in a relationship where you felt threatened, hurt or afraid?No   Does patient need assistance? Functional Status Self care Ambulation Normal Comments no missed doses   CC:  follow-up visit for labs and pt just started [place] and thinks he needs labs he is concerned about if it is working he needs refills on med.  History of Present Illness: Not missing any doses of meds, but expresses concerns over potential resistance or lack of effectiveness of regimen.  Just recently started Atripla.  Also having periodontal pain with associated difficulty chewing food.  Is slated to see a dentist in the next week.     Preventive Screening-Counseling & Management  Alcohol-Tobacco     Alcohol drinks/day: <1     Smoking Status: current     Packs/Day: <0.25     Year Started: 1987  Caffeine-Diet-Exercise     Caffeine use/day: 6 glasses     Caffeine Counseling: decrease use of caffeine     Does Patient Exercise: yes     Type of exercise: plays with dogs     Exercise (avg: min/session): <30     Times/week: 3  Safety-Violence-Falls     Seat Belt Use: yes      Sexual History:  currently monogamous.        Drug Use:  current, marijuana, and Yes.    Comments: pt accepted condoms  Allergies: 1)  ! Amoxicillin  Past History:  Past medical, surgical, family and social histories  (including risk factors) reviewed for relevance to current acute and chronic problems.  Past Medical History: Reviewed history from 02/09/2010 and no changes required. HIV disease  Family History: Reviewed history and no changes required.  Social History: Reviewed history and no changes required.  Review of Systems       The patient complains of headaches.  The patient denies anorexia, fever, weight loss, weight gain, vision loss, decreased hearing, hoarseness, chest pain, syncope, dyspnea on exertion, peripheral edema, prolonged cough, hemoptysis, abdominal pain, melena, hematochezia, severe indigestion/heartburn, hematuria, incontinence, genital sores, muscle weakness, suspicious skin lesions, transient blindness, difficulty walking, depression, unusual weight change, abnormal bleeding, enlarged lymph nodes, angioedema, and testicular masses.    Physical Exam  General:  alert, well-developed, well-nourished, and well-hydrated.   Head:  normocephalic and atraumatic.   Eyes:  vision grossly intact, pupils equal, pupils round, and pupils reactive to light.   Ears:  R ear normal and L ear normal.   Mouth:  poor dentition, halitosis, xerostomia, gingival inflammation, and white plaque(s).   Neck:  supple, full ROM, and no masses.   Lungs:  normal respiratory effort and normal breath sounds.   Heart:  normal rate and regular rhythm.  Abdomen:  soft, non-tender, normal bowel sounds, no hepatomegaly, and no splenomegaly.   Msk:  normal ROM, no joint tenderness, no joint swelling, and no joint warmth.   Neurologic:  alert & oriented X3, cranial nerves II-XII intact, strength normal in all extremities, and gait normal.   Skin:  turgor normal, color normal, no rashes, and no suspicious lesions.   Cervical Nodes:  Shod[facility] LAD A&P Psych:  Cognition and judgment appear intact. Alert and cooperative with normal attention span and concentration. No apparent delusions, illusions,  hallucinations   Impression & Recommendations:  Problem # 1:  HIV DISEASE (ICD-042) Repeat staging labs today before we make any decisions on the success/failure of regimen.  Reassured him the probability of response is much higher with his adherence to his regimen.  Will f/u in 4 weeks following his oral surgery care.  Orders: T-CBC w/Diff [phone]) T-CD4SP ([facility]) (CD4SP) T-Comprehensive Metabolic Panel [id number]) T-HIV Viral Load [phone]) Est. Patient Level IV (57846)  Problem # 2:  CANDIDIASIS, ORAL (ICD-112.0) Based on no lab data, cannot assume advncing disease.  Meanwhile will treat with Diflucan 100mg  once daily x 10 days. Orders: Est. Patient Level IV (96295)  Problem # 3:  PERIODONTAL DISEASE (ICD-523.9) Significant disease process present.  Will treat with doxycycline 100mg  by mouth q12h x 10 days and Flagyl 500 mg po q12h x 10 days and Peridex mouthwash two times a day.  He is to keep his scheduled apointment with dentist as originally planned.  We wil  given him Vicodin for pain for now. Orders: Est. Patient Level IV (28413)  Medications Added to Medication List This Visit: 1)  Flagyl 500 Mg Tabs (Metronidazole) .Marland Kitchen.. 1 tab twice a day for 10 days 2)  Ibuprofen 800 Mg Tabs (Ibuprofen) .Marland Kitchen.. 1 tab every 6 hours as needed for pain 3)  Hydrocodone-acetaminophen 7.5-500 Mg Tabs (Hydrocodone-acetaminophen) .Marland Kitchen.. 1 tablet by mouth every 6 hours as needed pain 4)  Doxycycline Hyclate 100 Mg Caps (Doxycycline hyclate) .Marland Kitchen.. 1 cap by mouth every 12 hours for 10 days 5)  Chlorhexidine Gluconate 0.12 % Soln (Chlorhexidine gluconate) .... Swish and spit 1/2 capful two times a day as directed. 6)  Diflucan 100 Mg Tabs (Fluconazole) .Marland Kitchen.. 1 tab by mouth once daily x 10 days Prescriptions: CHLORHEXIDINE GLUCONATE 0.12 % SOLN (CHLORHEXIDINE GLUCONATE) swish and spit 1/2 capful two times a day as directed.  #421ml x 2   Entered and Authorized by:   Talmadge Chad NP    Signed by:   Talmadge Chad NP on 02/05/2011   Method used:   Print then Give to Patient   RxID:   [id number] DOXYCYCLINE HYCLATE 100 MG CAPS (DOXYCYCLINE HYCLATE) 1 cap by mouth every 12 hours for 10 days  #20 x 0   Entered and Authorized by:   Talmadge Chad NP   Signed by:   Talmadge Chad NP on 02/05/2011   Method used:   Print then Give to Patient   RxID:   [id number] HYDROCODONE-ACETAMINOPHEN 7.5-500 MG TABS (HYDROCODONE-ACETAMINOPHEN) 1 tablet by mouth every 6 hours as needed pain  #50 x 0   Entered and Authorized by:   Talmadge Chad NP   Signed by:   Talmadge Chad NP on 02/05/2011   Method used:   Print then Give to Patient   RxID:   [id number]    Orders Added: 1)  T-CBC w/Diff [[id number]] 2)  T-CD4SP [facility]) [CD4SP] 3)  T-Comprehensive Metabolic Panel [[id number]] 4)  T-HIV Viral Load [[id number]] 5)  Est. Patient Level IV [16109]

## 2011-03-13 NOTE — Miscellaneous (Signed)
Summary: adap approved til 09/23/11  Clinical Lists Changes  Observations: Added new observation of AIDSDAP: Yes 2012 (03/05/2011 12:52)

## 2011-03-13 NOTE — Assessment & Plan Note (Signed)
Summary: f/u [mkj]   Vital Signs:  Patient profile:   40 year old male Height:      65 inches Weight:      131.8 pounds BMI:     22.01 Temp:     97.5 degrees F oral Pulse rate:   112 / minute BP sitting:   121 / 80  (left arm)  Vitals Entered By: Alesia Morin CMA (March 05, 2011 9:51 AM) CC: follow-up visit for labs. Pt also says that this morning abt 4am he started with vomiting and loose stools is not feeling well Is Patient Diabetic? No Pain Assessment Patient in pain? no      Nutritional Status BMI of 19 -24 = normal Nutritional Status Detail appetite "not good"  Have you ever been in a relationship where you felt threatened, hurt or afraid?No   Does patient need assistance? Functional Status Self care Ambulation Normal Comments no missed doses   CC:  follow-up visit for labs. Pt also says that this morning abt 4am he started with vomiting and loose stools is not feeling well.  History of Present Illness: Developed vomiting and diarrhea last noc.  Partner was ill with similar problems 2 days back and feels he "caught" whatever he had.  Had 2 emesis episodes since 4 am today.  No hemetemesis, no blood in stool.  Stools watery to loose.   (+) nausea as wel and generalized malaise.  Denies fever, chills or sweats.     Preventive Screening-Counseling & Management  Alcohol-Tobacco     Alcohol drinks/day: <1     Smoking Status: current     Packs/Day: <0.25     Year Started: 1987  Caffeine-Diet-Exercise     Caffeine use/day: 6 glasses     Caffeine Counseling: decrease use of caffeine     Does Patient Exercise: yes     Type of exercise: plays with dogs     Exercise (avg: min/session): <30     Times/week: 3  Safety-Violence-Falls     Seat Belt Use: yes      Sexual History:  currently monogamous.        Drug Use:  current, marijuana, and Yes.        Blood Transfusions:  no.        Travel History:  no.    Comments: pt declined condoms  Allergies: 1)  !  Amoxicillin  Social History: Blood Transfusions:  no Travel History:  no  Review of Systems       The patient complains of anorexia and abdominal pain.  The patient denies fever, weight loss, weight gain, vision loss, decreased hearing, hoarseness, chest pain, syncope, dyspnea on exertion, peripheral edema, prolonged cough, headaches, hemoptysis, melena, hematochezia, severe indigestion/heartburn, hematuria, incontinence, genital sores, muscle weakness, suspicious skin lesions, transient blindness, difficulty walking, depression, unusual weight change, abnormal bleeding, enlarged lymph nodes, angioedema, and testicular masses.    Physical Exam  General:  alert, well-developed, well-nourished, and well-hydrated.   Head:  normocephalic and atraumatic.   Eyes:  vision grossly intact, pupils equal, pupils round, and pupils reactive to light.   Ears:  R ear normal and L ear normal.   Nose:  no external deformity and no nasal discharge.   Mouth:  pharynx pink and moist and fair dentition.   Neck:  supple, full ROM, and no masses.   Lungs:  normal respiratory effort and normal breath sounds.   Heart:  normal rate, regular rhythm, no murmur, no gallop, and no rub.  Abdomen:  soft, no guarding, no rigidity, no rebound tenderness, and bowel sounds hyperactive.  Slight palpatory tenderness diffusely. Msk:  normal ROM.   Extremities:  No clubbing, cyanosis, edema, or deformity noted with normal full range of motion of all joints.   Neurologic:  alert & oriented X3, cranial nerves II-XII intact, strength normal in all extremities, and gait normal.   Skin:  turgor normal, color normal, no rashes, no suspicious lesions, and pale.   Psych:  Cognition and judgment appear intact. Alert and cooperative with normal attention span and concentration. No apparent delusions, illusions, hallucinations   Impression & Recommendations:  Problem # 1:  HIV DISEASE (ICD-042)  CD4 440 @ 23% with VL of 154  copies/ml.  Clinically stable on Atripla since starting in January 2012.  >2 log drop in VL showing good response.  Will CPM, recheck staging labs in 6 weeks and have f/u in 2 months.  verbaly acknowledged.  Orders: Est. Patient Level IV (99214)Future Orders: T-CBC w/Diff ([id number]) ... 04/16/2011 T-CD4SP ([facility]) (CD4SP) ... 04/16/2011 T-Comprehensive Metabolic Panel [id number]) ... 04/16/2011 T-HIV Genotype ([id number]) ... 04/16/2011  Problem # 2:  GASTROENTERITIS, ACUTE (ICD-558.9)  Clear liquid diet x 24 hours, progress to [employer] for an additional 48 hours, progress to low-fat, blanddiet.  Instructed to avoid dairy products and caffeinated substances for next 7-10 days.  Verbally acknowledged information.  Declined any meds for nausea at present.  Orders: Est. Patient Level IV (96295)  Other Orders: Future Orders: T-Lipid Profile ([id number]) ... 04/16/2011   Orders Added: 1)  Est. Patient Level IV [99214] 2)  T-CBC w/Diff [[id number]] 3)  T-CD4SP [facility]) [CD4SP] 4)  T-Comprehensive Metabolic Panel [[id number]] 5)  T-HIV Genotype [[id number]] 6)  T-Lipid Profile [[id number]]

## 2011-04-02 ENCOUNTER — Other Ambulatory Visit: Payer: Self-pay | Admitting: Adult Health

## 2011-04-02 ENCOUNTER — Other Ambulatory Visit: Payer: Self-pay

## 2011-04-02 DIAGNOSIS — K056 Periodontal disease, unspecified: Secondary | ICD-10-CM

## 2011-04-02 DIAGNOSIS — K0889 Other specified disorders of teeth and supporting structures: Secondary | ICD-10-CM

## 2011-04-02 MED ORDER — TRAMADOL HCL 50 MG PO TABS: 100.0000 mg | ORAL_TABLET | Freq: Four times a day (QID) | ORAL | Status: AC | PRN

## 2011-04-02 MED ORDER — DOXYCYCLINE HYCLATE 100 MG PO CAPS: 100.0000 mg | ORAL_CAPSULE | Freq: Two times a day (BID) | ORAL | Status: AC

## 2011-04-09 LAB — URINALYSIS, ROUTINE W REFLEX MICROSCOPIC
Nitrite: NEGATIVE
Protein, ur: 30 mg/dL — AB
Specific Gravity, Urine: 1.036 — ABNORMAL HIGH (ref 1.005–1.030)
Urobilinogen, UA: 2 mg/dL — ABNORMAL HIGH (ref 0.0–1.0)

## 2011-04-09 LAB — URINE MICROSCOPIC-ADD ON

## 2011-04-20 ENCOUNTER — Other Ambulatory Visit: Payer: Self-pay | Admitting: *Deleted

## 2011-04-20 DIAGNOSIS — B20 Human immunodeficiency virus [HIV] disease: Secondary | ICD-10-CM

## 2011-04-20 MED ORDER — EFAVIRENZ-EMTRICITAB-TENOFOVIR 600-200-300 MG PO TABS: 1.0000 | ORAL_TABLET | Freq: Every day | ORAL | Status: DC

## 2011-04-23 ENCOUNTER — Ambulatory Visit: Payer: Self-pay | Admitting: Adult Health

## 2011-09-20 ENCOUNTER — Telehealth: Payer: Self-pay | Admitting: Internal Medicine

## 2011-09-20 NOTE — Telephone Encounter (Signed)
Called patient to renew adap and person who answered said he was not even in this state. No other number to reach patient.

## 2011-10-02 LAB — URINALYSIS, ROUTINE W REFLEX MICROSCOPIC
Bilirubin Urine: NEGATIVE
Nitrite: NEGATIVE
Protein, ur: NEGATIVE
Specific Gravity, Urine: 1.011
Urobilinogen, UA: 1

## 2011-10-08 ENCOUNTER — Telehealth: Payer: Self-pay | Admitting: *Deleted

## 2011-10-08 NOTE — Telephone Encounter (Signed)
We rec'd a ROI. RN called asking for records to be faxed today as pt had appt today. I faxed them

## 2013-07-12 ENCOUNTER — Emergency Department (HOSPITAL_COMMUNITY): Payer: Self-pay

## 2013-07-12 ENCOUNTER — Encounter (HOSPITAL_COMMUNITY): Payer: Self-pay | Admitting: Physical Medicine and Rehabilitation

## 2013-07-12 ENCOUNTER — Emergency Department (HOSPITAL_COMMUNITY)
Admission: EM | Admit: 2013-07-12 | Discharge: 2013-07-12 | Disposition: A | Discharge status: Home / Self Care | Payer: Self-pay | Attending: Emergency Medicine | Admitting: Emergency Medicine

## 2013-07-12 DIAGNOSIS — R51 Headache: Secondary | ICD-10-CM | POA: Insufficient documentation

## 2013-07-12 DIAGNOSIS — R05 Cough: Secondary | ICD-10-CM | POA: Insufficient documentation

## 2013-07-12 DIAGNOSIS — J189 Pneumonia, unspecified organism: Secondary | ICD-10-CM | POA: Insufficient documentation

## 2013-07-12 DIAGNOSIS — F17200 Nicotine dependence, unspecified, uncomplicated: Secondary | ICD-10-CM | POA: Insufficient documentation

## 2013-07-12 DIAGNOSIS — B20 Human immunodeficiency virus [HIV] disease: Secondary | ICD-10-CM

## 2013-07-12 DIAGNOSIS — Z880 Allergy status to penicillin: Secondary | ICD-10-CM | POA: Insufficient documentation

## 2013-07-12 DIAGNOSIS — R509 Fever, unspecified: Secondary | ICD-10-CM | POA: Insufficient documentation

## 2013-07-12 DIAGNOSIS — Z21 Asymptomatic human immunodeficiency virus [HIV] infection status: Secondary | ICD-10-CM | POA: Insufficient documentation

## 2013-07-12 DIAGNOSIS — R059 Cough, unspecified: Secondary | ICD-10-CM | POA: Insufficient documentation

## 2013-07-12 HISTORY — DX: Asymptomatic human immunodeficiency virus (hiv) infection status: Z21

## 2013-07-12 HISTORY — DX: Human immunodeficiency virus (HIV) disease: B20

## 2013-07-12 MED ORDER — SULFAMETHOXAZOLE-TRIMETHOPRIM 800-160 MG PO TABS: 2.0000 | ORAL_TABLET | Freq: Three times a day (TID) | ORAL | Status: DC

## 2013-07-12 MED ORDER — SODIUM CHLORIDE 0.9 % IV BOLUS (SEPSIS)
1000.0000 mL | Freq: Once | INTRAVENOUS | Status: AC
  Administered 2013-07-12: 1000 mL via INTRAVENOUS

## 2013-07-12 NOTE — ED Provider Notes (Signed)
History    CSN: [id number] Arrival date & time 07/12/13  0801  First MD Initiated Contact with Patient 07/12/13 0813     Chief Complaint  Patient presents with  . Headache  . Nasal Congestion   (Consider location/radiation/quality/duration/timing/severity/associated sxs/prior Treatment) HPI Comments: A 42 year old male with history of HIV who presents today with a cough for the past 3 days. The cough is productive and he is bringing up a clear sputum. He reports he has a frontal headache and sinus pressure, worse when he leans his head forward. He has associated subjective fever, chills. He has taken Delsym with no relief. He has not been on any antivirals for his HIV in one year. He does not know his viral load. No nausea, vomiting, abdominal pain, tingling, numbness, weakness, paresthesias, rash.   The history is provided by the patient. No language interpreter was used.   Past Medical History  Diagnosis Date  . HIV (human immunodeficiency virus infection)    No past surgical history on file. History reviewed. No pertinent family history. History  Substance Use Topics  . Smoking status: Current Every Day Smoker    Types: Cigarettes  . Smokeless tobacco: Not on file  . Alcohol Use: Yes     Comment: social    Review of Systems  Constitutional: Positive for fever and chills.  HENT: Positive for congestion and sinus pressure.   Respiratory: Positive for cough.   Gastrointestinal: Negative for nausea, vomiting and abdominal pain.  Skin: Negative for rash.  All other systems reviewed and are negative.    Allergies  Amoxicillin  Home Medications   Current Outpatient Rx  Name  Route  Sig  Dispense  Refill  . chlorhexidine (PERIDEX) 0.12 % solution   Mouth/Throat   Use as directed 15 mLs in the mouth or throat 2 (two) times daily.           Marland Kitchen efavirenz-emtrictabine-tenofovir (ATRIPLA) 600-200-300 MG per tablet   Oral   Take 1 tablet by mouth at bedtime.   30  tablet   5   . HYDROcodone-acetaminophen (LORTAB) 7.5-500 MG per tablet   Oral   Take 1 tablet by mouth every 6 (six) hours as needed.           Marland Kitchen ibuprofen (ADVIL,MOTRIN) 800 MG tablet   Oral   Take 800 mg by mouth every 6 (six) hours as needed.            BP 136/86  Pulse 103  Temp(Src) 98.4 F (36.9 C) (Oral)  Resp 18  Ht 5\' 5"  (1.651 m)  Wt 125 lb (56.7 kg)  BMI 20.8 kg/m2  SpO2 97% Physical Exam  Nursing note and vitals reviewed. Constitutional: He is oriented to person, place, and time. He appears well-developed and well-nourished. No distress.  HENT:  Head: Normocephalic and atraumatic.  Right Ear: Tympanic membrane, external ear and ear canal normal.  Left Ear: Tympanic membrane, external ear and ear canal normal.  Nose: Right sinus exhibits frontal sinus tenderness. Right sinus exhibits no maxillary sinus tenderness. Left sinus exhibits frontal sinus tenderness. Left sinus exhibits no maxillary sinus tenderness.  Mouth/Throat: Uvula is midline and oropharynx is clear and moist. Mucous membranes are dry.  Eyes: Conjunctivae are normal.  Neck: Normal range of motion. No tracheal deviation present.  Cardiovascular: Normal rate, regular rhythm and normal heart sounds.   Pulmonary/Chest: Effort normal and breath sounds normal. No stridor.  Abdominal: Soft. He exhibits no distension. There is no tenderness.  Musculoskeletal: Normal range of motion.  Neurological: He is alert and oriented to person, place, and time.  Skin: Skin is warm and dry. He is not diaphoretic.  Psychiatric: He has a normal mood and affect. His behavior is normal.    ED Course  Procedures (including critical care time) Labs Reviewed - No data to display Dg Chest 2 View  07/12/2013   *RADIOLOGY REPORT*  Clinical Data: HIV and shortness of breath  CHEST - 2 VIEW  Comparison: 11/23/7  Findings: Normal heart size.  No pleural effusion or edema.  Mild hazy peribronchial opacities are identified  within both lower lobes.  Focal area of bronchiectasis is noted within the right lower lobe.  No lobar consolidation.  IMPRESSION:  1.  Mild bilateral peribronchial opacities which may reflect atypical infection.   Original Report Authenticated By: Signa Kell, M.D.   1. Atypical pneumonia   2. HIV (human immunodeficiency virus infection)     MDM  XR shows bilateral peribronchial opacities with may reflect atypical infection. Patient is HIV positive without any follow up or antiviral medication in the past year. Will imperically treat for PCP with Bactrim. Encouraged follow up with outpatient physician to manage HIV disease. Return instructions given. Vital signs stable for discharge. Work note given. Patient / Family / Caregiver informed of clinical course, understand medical decision-making process, and agree with plan.   Mora Bellman, PA-C 07/12/13 1157

## 2013-07-12 NOTE — ED Provider Notes (Signed)
Medical screening examination/treatment/procedure(s) were performed by non-physician practitioner and as supervising physician I was immediately available for consultation/collaboration.  Chick Cousins T Viraat Vanpatten, MD 07/12/13 1522 

## 2013-07-12 NOTE — ED Notes (Signed)
Pt presents to department for evaluation of headache, sinus congestion, cough and decreased appetite. Ongoing since Wednesday. 5/10 head pain at the time. Pt is alert and oriented x4. No signs of distress noted.

## 2013-09-28 ENCOUNTER — Encounter (HOSPITAL_COMMUNITY): Payer: Self-pay

## 2013-09-28 ENCOUNTER — Emergency Department (HOSPITAL_COMMUNITY)
Admission: EM | Admit: 2013-09-28 | Discharge: 2013-09-28 | Disposition: A | Discharge status: Home / Self Care | Payer: No Typology Code available for payment source | Attending: Emergency Medicine | Admitting: Emergency Medicine

## 2013-09-28 DIAGNOSIS — Z880 Allergy status to penicillin: Secondary | ICD-10-CM | POA: Insufficient documentation

## 2013-09-28 DIAGNOSIS — Z87891 Personal history of nicotine dependence: Secondary | ICD-10-CM | POA: Insufficient documentation

## 2013-09-28 DIAGNOSIS — H9209 Otalgia, unspecified ear: Secondary | ICD-10-CM | POA: Insufficient documentation

## 2013-09-28 DIAGNOSIS — J029 Acute pharyngitis, unspecified: Secondary | ICD-10-CM | POA: Insufficient documentation

## 2013-09-28 DIAGNOSIS — Z21 Asymptomatic human immunodeficiency virus [HIV] infection status: Secondary | ICD-10-CM | POA: Insufficient documentation

## 2013-09-28 DIAGNOSIS — B370 Candidal stomatitis: Secondary | ICD-10-CM | POA: Insufficient documentation

## 2013-09-28 MED ORDER — NYSTATIN 100000 UNIT/ML MT SUSP: 500000.0000 [IU] | Freq: Four times a day (QID) | OROMUCOSAL | Status: DC

## 2013-09-28 NOTE — ED Notes (Signed)
Patient denies travelling internationally, but denies having contact with anyone who has.

## 2013-09-28 NOTE — ED Notes (Signed)
Patient c/o thrush and a rash on his pelvic area and bilateral legs x 4 days.

## 2013-09-28 NOTE — Progress Notes (Signed)
[facility] provided pt with a list of primary care resources.  °

## 2013-09-28 NOTE — ED Provider Notes (Signed)
CSN: [id number]     Arrival date & time 09/28/13  1158 History  This chart was scribed for non-physician practitioner Arthor Captain, PA-C working with Derwood Kaplan, MD by Joaquin Music, ED Scribe. This patient was seen in room WTR5/WTR5 and the patient's care was started at 1:19 PM .    Chief Complaint  Patient presents with  . Rash  . Sore Throat  . Otalgia    Patient is a 42 y.o. male presenting with rash and pharyngitis. The history is provided by the patient. No language interpreter was used.  Rash Location:  Leg Leg rash location:  L upper leg and R upper leg Quality: scaling   Quality: not draining, not itchy and not painful   Severity:  Mild Onset quality:  Sudden Duration:  4 days Timing:  Constant Progression:  Unchanged Chronicity:  New Relieved by:  Nothing Worsened by:  Nothing tried Ineffective treatments:  None tried Associated symptoms: no abdominal pain, no headaches and no shortness of breath   Sore Throat This is a new problem. The current episode started more than 2 days ago. The problem occurs constantly. The problem has been gradually worsening. Pertinent negatives include no chest pain, no abdominal pain, no headaches and no shortness of breath. Nothing aggravates the symptoms. Nothing relieves the symptoms. He has tried nothing for the symptoms. The treatment provided no relief.   HPI Comments: Timothy Salas is a 42 y.o. male who presents to the Emergency Department complaining of otalgia onset 2 days. 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 Pt believes he has a bad case of "thrush." Pt has history of having thrush. Pt has been able to eat and take fluids in today as normal. Pt denies fever.   Pt also complain of a rash on bilaterally upper thigh onset 1 week. He believes the rash is due to taking many showers daily. Pt denies rash being painful or itchy. Pt states rash appears to be becoming scaly.    Past Medical History  Diagnosis  Date  . HIV (human immunodeficiency virus infection)    History reviewed. No pertinent past surgical history. History reviewed. No pertinent family history. History  Substance Use Topics  . Smoking status: Former Smoker    Types: Cigarettes  . Smokeless tobacco: Never Used  . Alcohol Use: Yes     Comment: social    Review of Systems  Respiratory: Negative for shortness of breath.   Cardiovascular: Negative for chest pain.  Gastrointestinal: Negative for abdominal pain.  Skin: Positive for rash.  Neurological: Negative for headaches.    Allergies  Amoxicillin  Home Medications   Current Outpatient Rx  Name  Route  Sig  Dispense  Refill  . Naproxen Sodium (ALEVE PO)   Oral   Take 1 tablet by mouth daily as needed (pain).          BP 107/71  Pulse 101  Temp(Src) 98.8 F (37.1 C) (Oral)  Resp 20  SpO2 97%  Physical Exam  Nursing note and vitals reviewed. Constitutional: He is oriented to person, place, and time. He appears well-developed and well-nourished. No distress.  Thin well appearing male NAD.  HENT:  Head: Normocephalic and atraumatic.  Geographic tongue with white plaques on tongue and throat with erythema. Airway is patent. No edema. Uvula midline.   Eyes: EOM are normal.  Neck: Neck supple. No tracheal deviation present.  Tonsillar adenopathy.   Cardiovascular: Normal rate, regular rhythm and normal heart sounds.  Pulmonary/Chest: Breath sounds normal.  Musculoskeletal: Normal range of motion.  Neurological: He is alert and oriented to person, place, and time.  Skin: Skin is warm and dry.  Several well circumscribed 1/2 cm nummular eruptions on superior thigh bilaterally. Scaling. No sign of secondary infections.   Psychiatric: He has a normal mood and affect. His behavior is normal.    ED Course  Procedures  DIAGNOSTIC STUDIES: Oxygen Saturation is 97% on RA, normal by my interpretation.    COORDINATION OF CARE: 1:24 PM-Discussed treatment  plan which includes prescribing medication and referring pt to [facility] and [facility].Pt agreed to plan.   Labs Review Labs Reviewed - No data to display Imaging Review No results found.  MDM   1. CANDIDIASIS, ORAL    HIV pos male with thrush off of his atripla for 1 year na dno ID follow up since moving to [place]. I am unsutr of th elesions on the patient's legs however I feel there warrant close follow up. I will discharge the patient to follow up with the community health and wellness as and ID nystatim solutiongiven. I personally performed the services described in this documentation, which was scribed in my presence. The recorded information has been reviewed and is accurate.     Arthor Captain, PA-C 10/01/13 2145

## 2013-10-02 NOTE — ED Provider Notes (Signed)
Medical screening examination/treatment/procedure(s) were performed by non-physician practitioner and as supervising physician I was immediately available for consultation/collaboration.  Derwood Kaplan, MD 10/02/13 [phone]

## 2013-10-05 ENCOUNTER — Encounter (HOSPITAL_COMMUNITY): Payer: Self-pay | Admitting: Emergency Medicine

## 2013-10-05 ENCOUNTER — Emergency Department (HOSPITAL_COMMUNITY)
Admission: EM | Admit: 2013-10-05 | Discharge: 2013-10-05 | Disposition: A | Discharge status: Home / Self Care | Payer: No Typology Code available for payment source | Attending: Emergency Medicine | Admitting: Emergency Medicine

## 2013-10-05 ENCOUNTER — Emergency Department (HOSPITAL_COMMUNITY): Payer: No Typology Code available for payment source

## 2013-10-05 DIAGNOSIS — Z87891 Personal history of nicotine dependence: Secondary | ICD-10-CM | POA: Insufficient documentation

## 2013-10-05 DIAGNOSIS — B20 Human immunodeficiency virus [HIV] disease: Secondary | ICD-10-CM

## 2013-10-05 DIAGNOSIS — H9209 Otalgia, unspecified ear: Secondary | ICD-10-CM | POA: Insufficient documentation

## 2013-10-05 DIAGNOSIS — Z21 Asymptomatic human immunodeficiency virus [HIV] infection status: Secondary | ICD-10-CM | POA: Insufficient documentation

## 2013-10-05 DIAGNOSIS — R21 Rash and other nonspecific skin eruption: Secondary | ICD-10-CM | POA: Insufficient documentation

## 2013-10-05 DIAGNOSIS — J189 Pneumonia, unspecified organism: Secondary | ICD-10-CM | POA: Insufficient documentation

## 2013-10-05 DIAGNOSIS — B370 Candidal stomatitis: Secondary | ICD-10-CM | POA: Insufficient documentation

## 2013-10-05 DIAGNOSIS — Z79899 Other long term (current) drug therapy: Secondary | ICD-10-CM | POA: Insufficient documentation

## 2013-10-05 DIAGNOSIS — J029 Acute pharyngitis, unspecified: Secondary | ICD-10-CM | POA: Insufficient documentation

## 2013-10-05 LAB — CBC WITH DIFFERENTIAL/PLATELET
Basophils Absolute: 0 10*3/uL (ref 0.0–0.1)
Eosinophils Absolute: 0.1 10*3/uL (ref 0.0–0.7)
Eosinophils Relative: 3 % (ref 0–5)
MCH: 28.5 pg (ref 26.0–34.0)
MCV: 85.5 fL (ref 78.0–100.0)
Platelets: 233 10*3/uL (ref 150–400)
RDW: 13.1 % (ref 11.5–15.5)

## 2013-10-05 LAB — COMPREHENSIVE METABOLIC PANEL
ALT: 14 U/L (ref 0–53)
AST: 20 U/L (ref 0–37)
Calcium: 8.4 mg/dL (ref 8.4–10.5)
Creatinine, Ser: 0.63 mg/dL (ref 0.50–1.35)
GFR calc Af Amer: >90 mL/min (ref >90)
Glucose, Bld: 91 mg/dL (ref 70–99)
Sodium: 131 mEq/L — ABNORMAL LOW (ref 135–145)
Total Protein: 8.1 g/dL (ref 6.0–8.3)

## 2013-10-05 LAB — RPR TITER: RPR Titer: 1:32 {titer} — AB

## 2013-10-05 MED ORDER — SULFAMETHOXAZOLE-TRIMETHOPRIM 800-160 MG PO TABS: 2.0000 | ORAL_TABLET | Freq: Three times a day (TID) | ORAL | Status: DC

## 2013-10-05 MED ORDER — MAGIC MOUTHWASH W/LIDOCAINE: 15.0000 mL | Freq: Three times a day (TID) | ORAL | Status: DC | PRN

## 2013-10-05 MED ORDER — FLUCONAZOLE 200 MG PO TABS: 200.0000 mg | ORAL_TABLET | Freq: Every day | ORAL | Status: AC

## 2013-10-05 MED ORDER — HYDROCODONE-ACETAMINOPHEN 5-325 MG PO TABS
1.0000 | ORAL_TABLET | Freq: Once | ORAL | Status: AC
  Administered 2013-10-05: 1 via ORAL
  Filled 2013-10-05: qty 1

## 2013-10-05 NOTE — ED Provider Notes (Signed)
Medical screening examination/treatment/procedure(s) were conducted as a shared visit with non-physician practitioner(s) and myself.  I personally evaluated the patient during the encounter  Timothy Salas is a 42 yo wm HIV, h/o thrush, ST, rash.  Pt has not been on antivirals, but has ID appt on 10/19/13.  VSS, pt is afebrile, nontoxic, in NAD, no hypoxia, no increased wob.  Plan to treat with bactrim, fluconazole, and magic mouthwash.  I had a lengthy d/w pt regarding his symptoms and his planned followup.  He agrees with plan and will f/u in ID.  He understands he has labwork pending (RPR) an the need to return to ER for worsening symptoms.    Darlys Gales, MD 10/05/13 2124

## 2013-10-05 NOTE — ED Notes (Signed)
Pt c/o bilateral ear pain, sts that his left ear started hurting first and feels like its worst than the right X 2 weeks. Pt also c/o thrush to mouth and a rash on groin that started 2 weeks ago also. Pt sts the rash has gotten worse. Pt in nad, skin warm and dry, resp e/u. Reports he has tried to make an appt with his doctor but unable to get in for a while.

## 2013-10-05 NOTE — ED Notes (Signed)
Pt sts he has been running a fever at home.

## 2013-10-05 NOTE — ED Provider Notes (Signed)
CSN: [id number]     Arrival date & time 10/05/13  1054 History   First MD Initiated Contact with Patient 10/05/13 1210     Chief Complaint  Patient presents with  . Dental Pain  . Rash  . Otalgia   (Consider location/radiation/quality/duration/timing/severity/associated sxs/prior Treatment) The history is provided by the patient and medical records.   This is a 42 year old male with past medical history significant for HIV, presenting to the ED for bilateral ear pain, sore throat, thrush, and rash.  Pt was seen in the ED on 09/28/13 for same and was treated with nystatin solution.  States initially there was some improvement but now his tongue feels so raw he can hardly eat because it is painful to chew.  Is tolerating PO fluids normally.  States both his ears hurt and feel full, L > R. No recent swimming or water submersion. Pt admits to some cough and subjective fever at home.  He has stopped smoking cigarettes (now using electronic cigarette).  Occasionally smokes marijuana.  Patient also complains of a flattened rash to his palms, soles, and groin area. States the lesions do not itch or hurt. No recent tick exposure or body aches.  Patient has been off of his Atripla for the past 9 months.  Patient was referred back to the infectious disease clinic and has a followup appointment on 10/19/2013. He states he tried to get an earlier appointment but there was none available.  Past Medical History  Diagnosis Date  . HIV (human immunodeficiency virus infection)    History reviewed. No pertinent past surgical history. History reviewed. No pertinent family history. History  Substance Use Topics  . Smoking status: Former Smoker    Types: Cigarettes  . Smokeless tobacco: Never Used  . Alcohol Use: Yes     Comment: social    Review of Systems  Constitutional: Positive for fever.  HENT: Positive for mouth sores and sore throat.   Respiratory: Positive for cough.   Skin: Positive for rash.   All other systems reviewed and are negative.    Allergies  Amoxicillin  Home Medications   Current Outpatient Rx  Name  Route  Sig  Dispense  Refill  . cetirizine (ZYRTEC) 10 MG tablet   Oral   Take 10 mg by mouth daily.         . Naproxen Sodium (ALEVE PO)   Oral   Take 1 tablet by mouth daily as needed (pain).         . nystatin (MYCOSTATIN) 100000 UNIT/ML suspension   Oral   Take 5 mLs (500,000 Units total) by mouth 4 (four) times daily.   60 mL   0    BP 111/66  Pulse 92  Temp(Src) 98.1 F (36.7 C) (Oral)  Resp 18  Ht 5\' 4"  (1.626 m)  Wt 120 lb 4.8 oz (54.568 kg)  BMI 20.64 kg/m2  SpO2 98%  Physical Exam  Nursing note and vitals reviewed. Constitutional: He is oriented to person, place, and time. He appears well-developed and well-nourished. No distress.  HENT:  Head: Normocephalic and atraumatic.  Mouth/Throat: Uvula is midline, oropharynx is clear and moist and mucous membranes are normal. He has dentures (upper). No oropharyngeal exudate, posterior oropharyngeal edema or posterior oropharyngeal erythema.  Bilateral EAC's mildly erythematous, TM's normal in appearance; pain with movement of external ear; thrush on tongue, none visible in posterior oropharynx; tonsils normal in appearance bilaterally without exudate; no difficulty swallowing; airway patent  Eyes: Conjunctivae and  EOM are normal. Pupils are equal, round, and reactive to light.  Neck: Normal range of motion.  Cardiovascular: Normal rate, regular rhythm and normal heart sounds.   Pulmonary/Chest: Effort normal and breath sounds normal. No respiratory distress. He has no wheezes.  Genitourinary: Penis normal. No discharge found.  Musculoskeletal: Normal range of motion.  Neurological: He is alert and oriented to person, place, and time.  Skin: Skin is warm and dry. Rash noted. No ecchymosis and no petechiae noted. Rash is macular. Rash is not vesicular. He is not diaphoretic.  Fine,  non-pruritic, macular rash of bilateral palms, soles, and groin; no petechia or ecchymosis; no signs of superimposed infection or cellulitis  Psychiatric: He has a normal mood and affect.    ED Course  Procedures (including critical care time) Labs Review Labs Reviewed  CBC WITH DIFFERENTIAL - Abnormal; Notable for the following:    WBC 3.9 (*)    RBC 3.72 (*)    Hemoglobin 10.6 (*)    HCT 31.8 (*)    All other components within normal limits  COMPREHENSIVE METABOLIC PANEL - Abnormal; Notable for the following:    Sodium 131 (*)    Potassium 3.4 (*)    BUN 5 (*)    Albumin 2.7 (*)    All other components within normal limits  RPR  T-HELPER CELLS (CD4) COUNT   Imaging Review Dg Chest 2 View  10/05/2013   CLINICAL DATA:  Chest pain. On and off fever. Brush.  EXAM: CHEST  2 VIEW  COMPARISON:  07/12/2013  FINDINGS: Stable hyperinflation of the lungs. Nodular densities project over the mid and lower lungs, increased since prior study. While this could reflect bronchiectasis, I cannot exclude infection, particularly atypical infection. No effusions. Linear atelectasis or scarring in the lung bases. No acute bony abnormality. Heart is normal size.  IMPRESSION: Increasing linear and nodular densities in the mid and lower lung zones. Cannot exclude atypical infection.   Electronically Signed   By: Charlett Nose M.D.   On: 10/05/2013 13:23    MDM   1. CANDIDIASIS, ORAL   2. Atypical pneumonia   3. HIV (human immunodeficiency virus infection)    Labs as above-- WBC count 3.9, CD4 pending.  CXR suspicious for atypical pneumonia.  Rash on palms and soles, concern for kaposi's sarcoma vs syphilis-- RPR pending.  Pt afebrile, non-toxic appearing, NAD, VS stable- ok for d/c. Given patient's HIV status we'll cover for PCP pneumonia. Rx Bactrim, fluconazole, and magic mouthwash.  FU with ID clinic-- has appt on 10/19/13.  Instructed he may contact wellness center if problems occur before appt.   Discussed plan with pt, he agreed.  Return precautions advised.  Discussed with Dr. Redgie Grayer who personally evaluated pt and agrees with assessment and plan.  Garlon Hatchet, PA-C 10/05/13 1521  Garlon Hatchet, PA-C 10/05/13 [phone]

## 2013-10-05 NOTE — ED Notes (Signed)
Pt c/o bilateral ear pain, sore throat, mouth pain and rash to groin area and feet; pt sts HIV+ and has not had any meds x 9 months

## 2013-10-06 ENCOUNTER — Telehealth: Payer: Self-pay | Admitting: *Deleted

## 2013-10-06 LAB — T-HELPER CELLS (CD4) COUNT (NOT AT ARMC): CD4 % Helper T Cell: 25 % — ABNORMAL LOW (ref 33–55)

## 2013-10-06 LAB — T.PALLIDUM AB, IGG: T pallidum Antibodies (TP-PA): >8 S/CO — ABNORMAL HIGH (ref <0.90)

## 2013-10-06 NOTE — Telephone Encounter (Signed)
Called patient to give him results of the positive RPR.  Pt can come in 10/15 at 4:00 for an injection.  Please advise how many injection visits he should have. Andree Coss, RN

## 2013-10-06 NOTE — Telephone Encounter (Signed)
Message copied by Andree Coss on Tue Oct 06, 2013  5:42 PM ------      Message from: Gardiner Barefoot      Created: Tue Oct 06, 2013  2:26 PM       He has been to the ED several times and recently got labs and is RPR positive, needs treatment.  Has appt on the 27th, perhaps we can get him in for penicillin now anyway.  thanks ------

## 2013-10-07 ENCOUNTER — Ambulatory Visit (INDEPENDENT_AMBULATORY_CARE_PROVIDER_SITE_OTHER): Payer: Self-pay | Admitting: Infectious Diseases

## 2013-10-07 DIAGNOSIS — A539 Syphilis, unspecified: Secondary | ICD-10-CM

## 2013-10-07 MED ORDER — PENICILLIN G BENZATHINE 1200000 UNIT/2ML IM SUSP
1.2000 10*6.[IU] | Freq: Once | INTRAMUSCULAR | Status: AC
  Administered 2013-10-07 (×2): 1.2 10*6.[IU] via INTRAMUSCULAR

## 2013-10-07 MED ORDER — PENICILLIN G BENZATHINE 1200000 UNIT/2ML IM SUSP: 1.2000 10*6.[IU] | Freq: Once | INTRAMUSCULAR | Status: DC

## 2013-10-07 NOTE — Telephone Encounter (Signed)
3 weekly shots, thanks

## 2013-10-07 NOTE — Progress Notes (Signed)
Bicillin #1 given.   Laurell Josephs, RN

## 2013-10-07 NOTE — ED Notes (Signed)
Treated by ID for Positive RPR.

## 2013-10-14 ENCOUNTER — Ambulatory Visit (INDEPENDENT_AMBULATORY_CARE_PROVIDER_SITE_OTHER): Payer: Self-pay | Admitting: *Deleted

## 2013-10-14 DIAGNOSIS — A539 Syphilis, unspecified: Secondary | ICD-10-CM

## 2013-10-14 MED ORDER — PENICILLIN G BENZATHINE 1200000 UNIT/2ML IM SUSP
1.2000 10*6.[IU] | Freq: Once | INTRAMUSCULAR | Status: AC
  Administered 2013-10-14: 1.2 10*6.[IU] via INTRAMUSCULAR

## 2013-10-14 NOTE — Progress Notes (Signed)
Pt verbalized no problems with last week's injections.  Just a little sore.

## 2013-10-19 ENCOUNTER — Ambulatory Visit (INDEPENDENT_AMBULATORY_CARE_PROVIDER_SITE_OTHER): Payer: Self-pay | Admitting: Internal Medicine

## 2013-10-19 ENCOUNTER — Encounter: Payer: Self-pay | Admitting: Internal Medicine

## 2013-10-19 VITALS — BP 110/65 | HR 80 | Temp 98.4°F | Ht 64.0 in | Wt 120.0 lb

## 2013-10-19 DIAGNOSIS — Z23 Encounter for immunization: Secondary | ICD-10-CM

## 2013-10-19 DIAGNOSIS — B20 Human immunodeficiency virus [HIV] disease: Secondary | ICD-10-CM

## 2013-10-19 NOTE — Progress Notes (Signed)
[facility] NOTE  RFV: re-engagement into care Subjective:    Patient ID: Timothy Salas, male    DOB: 09/19/1971, 42 y.o.   MRN: [id number]  HPI Timothy Salas is a 42yo caucasian male with HIV diagnosed in the mid 1980s, Cd 4 count of 210/VL unknown. Previously on atripla up until 10 months ago due to depression, in setting of partner stepping out of relationship. RPR 1:32 with rash on hands and feet which resolved after first injection.  Has finished 2 of 3 weekly PCN injections. Transferred from [place] for the past 3 years. Previously followed by [facility].  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 Lives in [place].  Combivir/viread in the past, then off of art for 5-56yr, then on atripla.  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   Current Outpatient Prescriptions on File Prior to Visit  Medication Sig Dispense Refill  . sulfamethoxazole-trimethoprim (SEPTRA DS) 800-160 MG per tablet Take 2 tablets by mouth 3 (three) times daily.  126 tablet  0   No current facility-administered medications on file prior to visit.   Active Ambulatory Problems    Diagnosis Date Noted  . HIV DISEASE 02/09/2010  . TOBACCO USER 02/23/2010  . PHARYNGITIS 09/18/2010  . 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 FACIAL RASH 02/09/2010  . CANDIDIASIS, ORAL 02/05/2011  . 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 GASTROENTERITIS, ACUTE 03/05/2011   Resolved Ambulatory Problems    Diagnosis Date Noted  . No Resolved Ambulatory Problems   Past Medical History  Diagnosis Date  . HIV (human immunodeficiency virus infection)     Soc hx: quit smoking cigs but using e-cig. Smokes mj. Rare alcohol, 1 drink/mo. Hanging out with someone presently  cares for his GM  Family hx: cad, father died at 108yo.; mother died at 14yo, 44 illness/surgery. GM still living   Review of Systems  Constitutional: Negative for fever, chills, diaphoresis, activity change, appetite change, fatigue and  unexpected weight change.  HENT: Negative for congestion, sore throat, rhinorrhea, sneezing, trouble swallowing and sinus pressure.  Eyes: Negative for photophobia and visual disturbance.  Respiratory: Negative for cough, chest tightness, shortness of breath, wheezing and stridor.  Cardiovascular: Negative for chest pain, palpitations and leg swelling.  Gastrointestinal: Negative for nausea, vomiting, abdominal pain, diarrhea, constipation, blood in stool, abdominal distention and anal bleeding.  Genitourinary: Negative for dysuria, hematuria, flank pain and difficulty urinating.  Musculoskeletal: Negative for myalgias, back pain, joint swelling, arthralgias and gait problem.  Skin: Negative for color change, pallor, rash and wound.  Neurological: Negative for dizziness, tremors, weakness and light-headedness.  Hematological: Negative for adenopathy. Does not bruise/bleed easily.  Psychiatric/Behavioral: Negative for behavioral problems, confusion, sleep disturbance, dysphoric mood, decreased concentration and agitation.     Objective:   Physical Exam BP 110/65  Pulse 80  Temp(Src) 98.4 F (36.9 C) (Oral)  Ht 5\' 4"  (1.626 m)  Wt 120 lb (54.432 kg)  BMI 20.59 kg/m2 Physical Exam  Constitutional: He is oriented to person, place, and time. He appears well-developed and well-nourished. No distress.  HENT:  Mouth/Throat: Oropharynx is clear and moist. No oropharyngeal exudate.  Cardiovascular: Normal rate, regular rhythm and normal heart sounds. Exam reveals no gallop and no friction rub.  No murmur heard.  Pulmonary/Chest: Effort normal and breath sounds normal. No respiratory distress. He has no wheezes.  Abdominal: Soft. Bowel sounds are normal. He exhibits no distension. There is no tenderness.  Lymphadenopathy:  He has no  cervical adenopathy.  Neurological: He is alert and oriented to person, place, and time.  Skin: Skin is warm and dry. No rash noted. No erythema.  Psychiatric: He  has a normal mood and affect. His behavior is normal.      Assessment & Plan:  HIV = will check HIV VL, genotype. Hep a,b,c ; adap just placed today. 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

## 2013-10-20 ENCOUNTER — Encounter: Payer: Self-pay | Admitting: *Deleted

## 2013-10-20 LAB — HEPATITIS B SURFACE ANTIBODY,QUALITATIVE

## 2013-10-21 ENCOUNTER — Ambulatory Visit (INDEPENDENT_AMBULATORY_CARE_PROVIDER_SITE_OTHER): Payer: Self-pay | Admitting: *Deleted

## 2013-10-21 DIAGNOSIS — A539 Syphilis, unspecified: Secondary | ICD-10-CM

## 2013-10-21 MED ORDER — PENICILLIN G BENZATHINE 1200000 UNIT/2ML IM SUSP
1.2000 10*6.[IU] | Freq: Once | INTRAMUSCULAR | Status: AC
  Administered 2013-10-21: 1.2 10*6.[IU] via INTRAMUSCULAR

## 2013-10-22 LAB — HIV-1 GENOTYPR PLUS

## 2013-10-28 NOTE — Telephone Encounter (Signed)
Opened in error

## 2013-10-29 ENCOUNTER — Other Ambulatory Visit: Payer: Self-pay | Admitting: *Deleted

## 2013-10-29 DIAGNOSIS — B20 Human immunodeficiency virus [HIV] disease: Secondary | ICD-10-CM

## 2013-10-29 MED ORDER — ELVITEG-COBIC-EMTRICIT-TENOFDF 150-150-200-300 MG PO TABS: 1.0000 | ORAL_TABLET | Freq: Every day | ORAL | Status: DC

## 2013-11-27 ENCOUNTER — Telehealth: Payer: Self-pay | Admitting: *Deleted

## 2013-11-27 ENCOUNTER — Other Ambulatory Visit: Payer: Self-pay | Admitting: *Deleted

## 2013-11-27 DIAGNOSIS — B20 Human immunodeficiency virus [HIV] disease: Secondary | ICD-10-CM

## 2013-11-27 MED ORDER — ELVITEG-COBIC-EMTRICIT-TENOFDF 150-150-200-300 MG PO TABS: 1.0000 | ORAL_TABLET | Freq: Every day | ORAL | Status: DC

## 2013-11-27 NOTE — Telephone Encounter (Signed)
Can you call in 7 days of fluc 200mg  daily for him

## 2013-11-27 NOTE — Telephone Encounter (Signed)
Notified patient of ADAP approval. He would like to pick it up monthly from [facility] on [address].  Pt also noted thrush-like symptoms, wanted to know if there is anything he could do before his follow up appointment 12/15.  Please advise. Andree Coss, RN

## 2013-11-30 ENCOUNTER — Other Ambulatory Visit: Payer: Self-pay | Admitting: *Deleted

## 2013-11-30 DIAGNOSIS — B370 Candidal stomatitis: Secondary | ICD-10-CM

## 2013-11-30 MED ORDER — FLUCONAZOLE 100 MG PO TABS: ORAL_TABLET | ORAL | Status: DC

## 2013-11-30 NOTE — Telephone Encounter (Signed)
Sent to his pharmacy.  Thank you.

## 2013-12-07 ENCOUNTER — Encounter (INDEPENDENT_AMBULATORY_CARE_PROVIDER_SITE_OTHER): Payer: Self-pay

## 2013-12-07 ENCOUNTER — Ambulatory Visit (INDEPENDENT_AMBULATORY_CARE_PROVIDER_SITE_OTHER): Payer: Self-pay | Admitting: Internal Medicine

## 2013-12-07 ENCOUNTER — Encounter: Payer: Self-pay | Admitting: Internal Medicine

## 2013-12-07 VITALS — BP 140/82 | HR 80 | Temp 97.7°F | Ht 64.0 in | Wt 130.0 lb

## 2013-12-07 DIAGNOSIS — B20 Human immunodeficiency virus [HIV] disease: Secondary | ICD-10-CM

## 2013-12-07 DIAGNOSIS — F17200 Nicotine dependence, unspecified, uncomplicated: Secondary | ICD-10-CM

## 2013-12-07 NOTE — Progress Notes (Signed)
   Subjective:    Patient ID: Timothy Salas, male    DOB: 09-07-1971, 42 y.o.   MRN: [id number]  HPI 41yo M with Cd 4 count of 210/VL 25K. Has adap approval wants to start meds on his bday on 12/27  Swimmer's ear right ear. 2 episodes lightheadedness blurry vision.  Current Outpatient Prescriptions on File Prior to Visit  Medication Sig Dispense Refill  . fluconazole (DIFLUCAN) 100 MG tablet Take 200 mg (2 tablets) by mouth daily for 1 week.  14 tablet  0  . elvitegravir-cobicistat-emtricitabine-tenofovir (STRIBILD) 150-150-200-300 MG TABS tablet Take 1 tablet by mouth daily with breakfast.  30 tablet  5   No current facility-administered medications on file prior to visit.   Active Ambulatory Problems    Diagnosis Date Noted  . HIV DISEASE 02/09/2010  . TOBACCO USER 02/23/2010  . PHARYNGITIS 09/18/2010  . 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 FACIAL RASH 02/09/2010  . CANDIDIASIS, ORAL 02/05/2011  . 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 GASTROENTERITIS, ACUTE 03/05/2011   Resolved Ambulatory Problems    Diagnosis Date Noted  . No Resolved Ambulatory Problems   Past Medical History  Diagnosis Date  . HIV (human immunodeficiency virus infection)    Soc hx: smokes e cig. 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 Also cares for grandmother who is 19. In a relationship   Review of Systems     Objective:   Physical Exam BP 140/82  Pulse 80  Temp(Src) 97.7 F (36.5 C) (Oral)  Ht 5\' 4"  (1.626 m)  Wt 130 lb (58.968 kg)  BMI 22.30 kg/m2 Physical Exam  Constitutional: He is oriented to person, place, and time. He appears well-developed and well-nourished. No distress.  HENT:  Mouth/Throat: Oropharynx is clear and moist. No oropharyngeal exudate. TM occluded with cerumen but partial view of TMs which are clear Cardiovascular: Normal rate, regular rhythm and normal heart sounds. Exam reveals no gallop and no friction rub.  No murmur heard.  Pulmonary/Chest: Effort normal and  breath sounds normal. No respiratory distress. He has no wheezes.  Abdominal: Soft. Bowel sounds are normal. He exhibits no distension. There is no tenderness.  Lymphadenopathy:  He has no cervical adenopathy.  Neurological: He is alert and oriented to person, place, and time.  Skin: Skin is warm and dry. No rash noted. No erythema.  Psychiatric: He has a normal mood and affect. His behavior is normal.          Assessment & Plan:  hiv = uncontrolled, start stribild on 12/27  Health maintenance = will get hep a vaccine at next visit. 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   Smoking cessation = precontemplative. Recommended to decrease e-cigarette use  rtc 6 wks.

## 2013-12-10 ENCOUNTER — Ambulatory Visit (INDEPENDENT_AMBULATORY_CARE_PROVIDER_SITE_OTHER): Payer: Self-pay | Admitting: Internal Medicine

## 2013-12-10 ENCOUNTER — Encounter: Payer: Self-pay | Admitting: Internal Medicine

## 2013-12-10 VITALS — BP 133/80 | HR 76 | Temp 97.6°F | Ht 64.0 in | Wt 134.0 lb

## 2013-12-10 DIAGNOSIS — L03011 Cellulitis of right finger: Secondary | ICD-10-CM

## 2013-12-10 DIAGNOSIS — L03019 Cellulitis of unspecified finger: Secondary | ICD-10-CM

## 2013-12-10 MED ORDER — OXYCODONE-ACETAMINOPHEN 5-325 MG PO TABS: 1.0000 | ORAL_TABLET | Freq: Four times a day (QID) | ORAL | Status: DC | PRN

## 2013-12-10 MED ORDER — SULFAMETHOXAZOLE-TMP DS 800-160 MG PO TABS: 1.0000 | ORAL_TABLET | Freq: Two times a day (BID) | ORAL | Status: DC

## 2013-12-10 NOTE — Progress Notes (Signed)
   Subjective:    Patient ID: Timothy Salas, male    DOB: 08-19-1971, 42 y.o.   MRN: [id number]  HPI 42yo M with HIV, CD 4 count of 210/VL 25K, currently off of ART to start at end of the month. He was seen this week but now presents with 1 day history of Right thumb swelling, pain, throbbing. He denies injury at work or puncture or pulling on hang nail. No exudate or drainage yet. He has placed warm compress on his thumb. No fever, chills, nightsweats  Current Outpatient Prescriptions on File Prior to Visit  Medication Sig Dispense Refill  . elvitegravir-cobicistat-emtricitabine-tenofovir (STRIBILD) 150-150-200-300 MG TABS tablet Take 1 tablet by mouth daily with breakfast.  30 tablet  5  . fluconazole (DIFLUCAN) 100 MG tablet Take 200 mg (2 tablets) by mouth daily for 1 week.  14 tablet  0   No current facility-administered medications on file prior to visit.   Active Ambulatory Problems    Diagnosis Date Noted  . HIV DISEASE 02/09/2010  . TOBACCO USER 02/23/2010  . PHARYNGITIS 09/18/2010  . 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 FACIAL RASH 02/09/2010  . CANDIDIASIS, ORAL 02/05/2011  . 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 GASTROENTERITIS, ACUTE 03/05/2011   Resolved Ambulatory Problems    Diagnosis Date Noted  . No Resolved Ambulatory Problems   Past Medical History  Diagnosis Date  . HIV (human immunodeficiency virus infection)       Review of Systems     Objective:   Physical Exam BP 133/80  Pulse 76  Temp(Src) 97.6 F (36.4 C) (Oral)  Ht 5\' 4"  (1.626 m)  Wt 134 lb (60.782 kg)  BMI 22.99 kg/m2 gen = a x o by 3 in NAD Ext: right thumb swelling along nail bed not, warm to touch, tender. No drainage. No difficulty with range of motion. Not involving joint.       Assessment & Plan:   parynochia = will give him rx for bactrim and pain medication. To use compresses. Bactrim DS 1 tab BID x 10 days  hiv = start stribild at end of the month as previously  discussed

## 2014-02-04 ENCOUNTER — Encounter: Payer: Self-pay | Admitting: Internal Medicine

## 2014-02-04 ENCOUNTER — Ambulatory Visit (INDEPENDENT_AMBULATORY_CARE_PROVIDER_SITE_OTHER): Payer: Self-pay | Admitting: Internal Medicine

## 2014-02-04 VITALS — BP 143/80 | HR 85 | Temp 97.8°F | Wt 132.0 lb

## 2014-02-04 DIAGNOSIS — B20 Human immunodeficiency virus [HIV] disease: Secondary | ICD-10-CM

## 2014-02-04 DIAGNOSIS — A539 Syphilis, unspecified: Secondary | ICD-10-CM

## 2014-02-04 DIAGNOSIS — F17200 Nicotine dependence, unspecified, uncomplicated: Secondary | ICD-10-CM

## 2014-02-04 LAB — COMPLETE METABOLIC PANEL WITH GFR
ALBUMIN: 3.9 g/dL (ref 3.5–5.2)
ALK PHOS: 81 U/L (ref 39–117)
ALT: 21 U/L (ref 0–53)
AST: 22 U/L (ref 0–37)
BILIRUBIN TOTAL: 0.3 mg/dL (ref 0.2–1.2)
BUN: 7 mg/dL (ref 6–23)
CO2: 31 mEq/L (ref 19–32)
CREATININE: 0.76 mg/dL (ref 0.50–1.35)
Calcium: 9.4 mg/dL (ref 8.4–10.5)
Chloride: 100 mEq/L (ref 96–112)
GFR, Est African American: >89 mL/min
GLUCOSE: 96 mg/dL (ref 70–99)
Potassium: 4.3 mEq/L (ref 3.5–5.3)
Sodium: 136 mEq/L (ref 135–145)
Total Protein: 8.2 g/dL (ref 6.0–8.3)

## 2014-02-04 LAB — CBC WITH DIFFERENTIAL/PLATELET
BASOS PCT: 0 % (ref 0–1)
Basophils Absolute: 0 10*3/uL (ref 0.0–0.1)
EOS ABS: 0.1 10*3/uL (ref 0.0–0.7)
EOS PCT: 1 % (ref 0–5)
HEMATOCRIT: 42 % (ref 39.0–52.0)
HEMOGLOBIN: 14.5 g/dL (ref 13.0–17.0)
Lymphocytes Relative: 33 % (ref 12–46)
Lymphs Abs: 2 10*3/uL (ref 0.7–4.0)
MCH: 29.8 pg (ref 26.0–34.0)
MCHC: 34.5 g/dL (ref 30.0–36.0)
MCV: 86.2 fL (ref 78.0–100.0)
MONO ABS: 0.3 10*3/uL (ref 0.1–1.0)
MONOS PCT: 6 % (ref 3–12)
Neutro Abs: 3.6 10*3/uL (ref 1.7–7.7)
Neutrophils Relative %: 60 % (ref 43–77)
Platelets: 233 10*3/uL (ref 150–400)
RBC: 4.87 MIL/uL (ref 4.22–5.81)
RDW: 13.9 % (ref 11.5–15.5)
WBC: 6 10*3/uL (ref 4.0–10.5)

## 2014-02-04 NOTE — Progress Notes (Signed)
   Subjective:    Patient ID: Timothy Salas, male    DOB: 10/04/1971, 43 y.o.   MRN: [id number]  HPI 43yo M with HIV, just started on stribild on jan 1st doing well with adherence, no missing doses ,no side effect. Doing well. No recent illness. Had nasal congestion x 2 days and recovered. He states he is overall doing well, continues to work long hours, 40+, often does not have break to take his meds but manages to keep taking them daily  Current Outpatient Prescriptions on File Prior to Visit  Medication Sig Dispense Refill  . elvitegravir-cobicistat-emtricitabine-tenofovir (STRIBILD) 150-150-200-300 MG TABS tablet Take 1 tablet by mouth daily with breakfast.  30 tablet  5   No current facility-administered medications on file prior to visit.   Active Ambulatory Problems    Diagnosis Date Noted  . HIV DISEASE 02/09/2010  . TOBACCO USER 02/23/2010  . PHARYNGITIS 09/18/2010  . 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 FACIAL RASH 02/09/2010  . CANDIDIASIS, ORAL 02/05/2011  . 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 GASTROENTERITIS, ACUTE 03/05/2011   Resolved Ambulatory Problems    Diagnosis Date Noted  . No Resolved Ambulatory Problems   Past Medical History  Diagnosis Date  . HIV (human immunodeficiency virus infection)    History  Substance Use Topics  . Smoking status: Former Smoker    Types: Cigarettes  . Smokeless tobacco: Never Used     Comment: e-cigarettes  . Alcohol Use: No     Review of Systems     Objective:   Physical Exam BP 143/80  Pulse 85  Temp(Src) 97.8 F (36.6 C) (Oral)  Wt 132 lb (59.875 kg) Physical Exam  Constitutional: He is oriented to person, place, and time. He appears well-developed and well-nourished. No distress.  HENT:  Mouth/Throat: Oropharynx is clear and moist. No oropharyngeal exudate. Poor dentition Cardiovascular: Normal rate, regular rhythm and normal heart sounds. Exam reveals no gallop and no friction rub.  No murmur heard.    Pulmonary/Chest: Effort normal and breath sounds normal. No respiratory distress. He has no wheezes.   Skin: Skin is warm and dry. No rash noted. No erythema.       Assessment & Plan:  hiv = just started tx. Will check labs to see if getting to goal with viral suppression  Dentition = will refer to dental clinic  Smoking cessation = precontemplative. 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

## 2014-02-05 LAB — HIV-1 RNA QUANT-NO REFLEX-BLD
HIV 1 RNA QUANT: 27 {copies}/mL — AB (ref <20)
HIV-1 RNA Quant, Log: 1.43 {Log} — ABNORMAL HIGH (ref <1.30)

## 2014-02-05 LAB — T-HELPER CELL (CD4) - (RCID CLINIC ONLY)
CD4 T CELL ABS: 440 /uL (ref 400–2700)
CD4 T CELL HELPER: 23 % — AB (ref 33–55)

## 2014-02-12 ENCOUNTER — Telehealth: Payer: Self-pay | Admitting: *Deleted

## 2014-02-12 NOTE — Telephone Encounter (Signed)
Patient called and advised that he is feeling bad. He advised he is very congested and works around food and wants to know if he could get a note to be out of work 02/13/14 and 02-14-14 as he is having to blow his nose a lot and does not want to make everyone sick. Advised him will have to send his doctor a message and call him back with an answer.

## 2014-02-15 ENCOUNTER — Telehealth: Payer: Self-pay | Admitting: *Deleted

## 2014-02-15 DIAGNOSIS — R059 Cough, unspecified: Secondary | ICD-10-CM

## 2014-02-15 DIAGNOSIS — R05 Cough: Secondary | ICD-10-CM

## 2014-02-15 MED ORDER — GUAIFENESIN-CODEINE 100-10 MG/5ML PO SOLN: 5.0000 mL | Freq: Three times a day (TID) | ORAL | Status: DC | PRN

## 2014-02-15 NOTE — Telephone Encounter (Signed)
Requesting rx for cough, using Flonase and Mucinex.  "Cold" symptoms improving but cough remains.  Last rx given for cough was generic Delsym.  Pt uses [facility] on [address].  MD please advise.

## 2014-02-15 NOTE — Addendum Note (Signed)
Addended by: Jennet MaduroESTRIDGE, Jeroline Wolbert D on: 02/15/2014 02:30 PM   Modules accepted: Orders

## 2014-02-15 NOTE — Telephone Encounter (Signed)
Received telephone order for robitussin w/ codiene.

## 2014-03-15 ENCOUNTER — Ambulatory Visit: Payer: Self-pay

## 2014-04-06 ENCOUNTER — Encounter: Payer: Self-pay | Admitting: Internal Medicine

## 2014-04-06 ENCOUNTER — Ambulatory Visit: Payer: No Typology Code available for payment source

## 2014-04-06 ENCOUNTER — Ambulatory Visit (INDEPENDENT_AMBULATORY_CARE_PROVIDER_SITE_OTHER): Payer: No Typology Code available for payment source | Admitting: Internal Medicine

## 2014-04-06 ENCOUNTER — Ambulatory Visit: Payer: Self-pay

## 2014-04-06 ENCOUNTER — Ambulatory Visit: Payer: Self-pay | Admitting: Internal Medicine

## 2014-04-06 ENCOUNTER — Encounter: Payer: Self-pay | Admitting: *Deleted

## 2014-04-06 ENCOUNTER — Other Ambulatory Visit: Payer: Self-pay | Admitting: *Deleted

## 2014-04-06 VITALS — BP 135/78 | HR 83 | Temp 98.2°F | Wt 136.0 lb

## 2014-04-06 DIAGNOSIS — B20 Human immunodeficiency virus [HIV] disease: Secondary | ICD-10-CM

## 2014-04-06 MED ORDER — ELVITEG-COBIC-EMTRICIT-TENOFDF 150-150-200-300 MG PO TABS: 1.0000 | ORAL_TABLET | Freq: Every day | ORAL | Status: DC

## 2014-04-06 NOTE — Progress Notes (Signed)
[employer] application 

## 2014-04-06 NOTE — Progress Notes (Signed)
   Subjective:    Patient ID: Timothy Salas, male    DOB: 06-29-1971, 43 y.o.   MRN: [id number]  HPI42yo M with HIV, CD 4 count of 440, VL 27 (after being on stribild for 1 month). Doing well with his adherence no missing doses. He is currently switching over to insurance through work. He did report being on his roof and noticed a possum thus quickly descended but injured Right shoulder in the process. Feels sore no swelling, redness, puncture or decrease in range of motion. He reports questions of utility of hpv vaccine and how to be screened for it. He denies anal warts. He is usually the receptive partner  Current Outpatient Prescriptions on File Prior to Visit  Medication Sig Dispense Refill  . elvitegravir-cobicistat-emtricitabine-tenofovir (STRIBILD) 150-150-200-300 MG TABS tablet Take 1 tablet by mouth daily with breakfast.  30 tablet  5   No current facility-administered medications on file prior to visit.   Active Ambulatory Problems    Diagnosis Date Noted  . HIV DISEASE 02/09/2010  . TOBACCO USER 02/23/2010  . PHARYNGITIS 09/18/2010  . 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 FACIAL RASH 02/09/2010  . CANDIDIASIS, ORAL 02/05/2011  . 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 GASTROENTERITIS, ACUTE 03/05/2011   Resolved Ambulatory Problems    Diagnosis Date Noted  . No Resolved Ambulatory Problems   Past Medical History  Diagnosis Date  . HIV (human immunodeficiency virus infection)     Sochx: uses e-cigarettes and marijuana   Review of Systems Insomnia, GERD    Objective:   Physical Exam BP 135/78  Pulse 83  Temp(Src) 98.2 F (36.8 C) (Oral)  Wt 136 lb (61.689 kg) Physical Exam  Constitutional: He is oriented to person, place, and time. He appears well-developed and well-nourished. No distress.  HENT: dry mouth Mouth/Throat: Oropharynx is clear and moist. No oropharyngeal exudate.  Cardiovascular: Normal rate, regular rhythm and normal heart sounds. Exam reveals no  gallop and no friction rub.  No murmur heard.  Pulmonary/Chest: Effort normal and breath sounds normal. No respiratory distress. He has no wheezes.  Abdominal: Soft. Bowel sounds are normal. He exhibits no distension. There is no tenderness.  Lymphadenopathy:  He has no cervical adenopathy.  Neurological: He is alert and oriented to person, place, and time.  Skin: Skin is warm and dry. No rash noted. No erythema.  Psychiatric: He has a normal mood and affect. His behavior is normal.        Assessment & Plan:  hiv = continue with stribild. Likely undetectable since last visit, since last labs were VL 27.   Health maintenance =will need hep A #1, and redo hep B series start it in 4 wk   hpv ? = ? Consider anal pap  rtc in 4 wk

## 2014-05-04 ENCOUNTER — Encounter: Payer: Self-pay | Admitting: Internal Medicine

## 2014-05-04 ENCOUNTER — Ambulatory Visit (INDEPENDENT_AMBULATORY_CARE_PROVIDER_SITE_OTHER): Payer: No Typology Code available for payment source | Admitting: Internal Medicine

## 2014-05-04 VITALS — BP 145/92 | HR 68 | Temp 98.6°F | Wt 136.0 lb

## 2014-05-04 DIAGNOSIS — B20 Human immunodeficiency virus [HIV] disease: Secondary | ICD-10-CM

## 2014-05-04 DIAGNOSIS — Z7189 Other specified counseling: Secondary | ICD-10-CM

## 2014-05-04 DIAGNOSIS — Z23 Encounter for immunization: Secondary | ICD-10-CM

## 2014-05-04 DIAGNOSIS — Z0000 Encounter for general adult medical examination without abnormal findings: Secondary | ICD-10-CM

## 2014-05-04 DIAGNOSIS — Z716 Tobacco abuse counseling: Secondary | ICD-10-CM

## 2014-05-04 NOTE — Progress Notes (Signed)
   Subjective:    Patient ID: Timothy GarinWilliam Salas, male    DOB: 1971/09/07, 43 y.o.   MRN: [id number]  HPI HPI42yo M with HIV, CD 4 count of 440, VL 27 (feb 2015) now on stribild for 2 months. He reports 2 missing doses since he started up on stribild due to adap waiting period. He is doing well overall. Only works during the week at [employer] no longer working weekends. No complaints of stress. 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  Current Outpatient Prescriptions on File Prior to Visit  Medication Sig Dispense Refill  . elvitegravir-cobicistat-emtricitabine-tenofovir (STRIBILD) 150-150-200-300 MG TABS tablet Take 1 tablet by mouth daily with breakfast.  30 tablet  11   No current facility-administered medications on file prior to visit.   Active Ambulatory Problems    Diagnosis Date Noted  . HIV DISEASE 02/09/2010  . TOBACCO USER 02/23/2010  . PHARYNGITIS 09/18/2010  . 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 FACIAL RASH 02/09/2010  . CANDIDIASIS, ORAL 02/05/2011  . 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 GASTROENTERITIS, ACUTE 03/05/2011   Resolved Ambulatory Problems    Diagnosis Date Noted  . No Resolved Ambulatory Problems   Past Medical History  Diagnosis Date  . HIV (human immunodeficiency virus infection)     Shx: smokes 1/3-1/2 ppd   Review of Systems 10 point ros is negative    Objective:   Physical Exam BP 145/92  Pulse 68  Temp(Src) 98.6 F (37 C) (Oral)  Wt 136 lb (61.689 kg) Physical Exam  Constitutional: He is oriented to person, place, and time. He appears well-developed and well-nourished. No distress.  HENT:  Mouth/Throat: Oropharynx is clear and moist. No oropharyngeal exudate.  Cardiovascular: Normal rate, regular rhythm and normal heart sounds. Exam reveals no gallop and no friction rub.  No murmur heard.  Pulmonary/Chest: Effort normal and breath sounds normal. No respiratory distress. He has no wheezes.  Abdominal: Soft. Bowel sounds  are normal. He exhibits no distension. There is no tenderness.  Lymphadenopathy:  He has no cervical adenopathy.  Neurological: He is alert and oriented to person, place, and time.  Skin: Skin is warm and dry. No rash noted. No erythema.  Psychiatric: He has a normal mood and affect. His behavior is normal.           Assessment & Plan:  hiv = well controlled. Will continue with stribild. Discussed importance of adherence. Give him stribild co pay card to when insurance is implemented.  Smoking cessation = spent 3 min discussing ways to do harm reduction/ cessation  Health maintenance = will give hep #1 today.   rtc in 3 months, labs 2 wks prior

## 2014-05-04 NOTE — Addendum Note (Signed)
Addended by: Lurlean LeydenPOOLE, Mysti Haley F on: 05/04/2014 01:57 PM   Modules accepted: Orders

## 2014-06-01 ENCOUNTER — Ambulatory Visit (INDEPENDENT_AMBULATORY_CARE_PROVIDER_SITE_OTHER): Payer: No Typology Code available for payment source | Admitting: *Deleted

## 2014-06-01 DIAGNOSIS — B20 Human immunodeficiency virus [HIV] disease: Secondary | ICD-10-CM

## 2014-06-01 DIAGNOSIS — Z23 Encounter for immunization: Secondary | ICD-10-CM

## 2014-06-04 ENCOUNTER — Ambulatory Visit: Payer: Self-pay

## 2014-07-16 ENCOUNTER — Telehealth: Payer: Self-pay | Admitting: Licensed Clinical Social Worker

## 2014-07-16 NOTE — Telephone Encounter (Signed)
Patient called very upset and crying, he stated he has been out of his medication for 2 weeks and did know what to do, he also stated that since switching to commercial insurance from [employer] his copay was 300 dollars. He said he was stressed and very panicky due to financial stressors. He admitted to not caring about things anymore, but was not suicidal and did not want to harm himself. I explained to him about our co pay assistance cards that would cover the full cost of his co pay, and I also made him an appointment to see Franne FortsKenny Shore on Monday. Patient stated he felt better since he talked to me and knowing that we can help with copays.

## 2014-07-19 ENCOUNTER — Ambulatory Visit: Payer: No Typology Code available for payment source

## 2014-08-05 ENCOUNTER — Ambulatory Visit: Payer: Self-pay | Admitting: Internal Medicine

## 2014-08-16 ENCOUNTER — Ambulatory Visit (INDEPENDENT_AMBULATORY_CARE_PROVIDER_SITE_OTHER): Payer: Self-pay | Admitting: Internal Medicine

## 2014-08-16 ENCOUNTER — Encounter: Payer: Self-pay | Admitting: Internal Medicine

## 2014-08-16 VITALS — BP 143/83 | HR 78 | Temp 97.3°F | Wt 136.0 lb

## 2014-08-16 DIAGNOSIS — Z23 Encounter for immunization: Secondary | ICD-10-CM

## 2014-08-16 NOTE — Progress Notes (Signed)
Patient ID: Timothy Salas, male   DOB: [birth year], 43 y.o.   MRN: [id number]        Patient ID: Timothy Salas, male   DOB: 15-Apr-1971, 43 y.o.   MRN: [id number]  HPI  Has not been able to afford his hiv meds for hte past 2 months. He applied for adap today. 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 elvitegravir-cobicistat-emtricitabine-tenofovir (STRIBILD) 150-150-200-300 MG TABS tablet Take 1 tablet by mouth daily with breakfast.     Patient Active Problem List   Diagnosis Date Noted  . GASTROENTERITIS, ACUTE 03/05/2011  . CANDIDIASIS, ORAL 02/05/2011  . 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 PHARYNGITIS 09/18/2010  . TOBACCO USER 02/23/2010  . HIV DISEASE 02/09/2010  . 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 FACIAL RASH 02/09/2010     Health Maintenance Due  Topic Date Due  . Tetanus/tdap  12/19/1988  . Influenza Vaccine  07/24/2014     Review of Systems 10 point ros is negative Physical Exam   BP 143/83  Pulse 78  Temp(Src) 97.3 F (36.3 C) (Oral)  Wt 136 lb (61.689 kg) Physical Exam  Constitutional: He is oriented to person, place, and time. He appears well-developed and well-nourished. No distress.  HENT:  Mouth/Throat: Oropharynx is clear and moist. No oropharyngeal exudate.  Cardiovascular: Normal rate, regular rhythm and normal heart sounds. Exam reveals no gallop and no friction rub.  No murmur heard.  Pulmonary/Chest: Effort normal and breath sounds normal. No respiratory distress. He has no wheezes.  Abdominal: Soft. Bowel sounds are normal. He exhibits no distension. There is no tenderness.  Lymphadenopathy:  He has no cervical adenopathy.  Neurological: He is alert and oriented to person, place, and time.  Skin: Skin is warm and dry. No rash noted. No erythema. Chapped hands + Psychiatric: He has a normal mood and  affect. His behavior is normal.    Lab Results  Component Value Date   CD4TCELL 23* 02/04/2014   Lab Results  Component Value Date   CD4TABS 440 02/04/2014   CD4TABS 210* 10/05/2013   CD4TABS 440 02/05/2011   Lab Results  Component Value Date   HIV1RNAQUANT 27* 02/04/2014   Lab Results  Component Value Date   HEPBSAB INDETER* 10/19/2013   No results found for this basename: RPR    CBC Lab Results  Component Value Date   WBC 6.0 02/04/2014   RBC 4.87 02/04/2014   HGB 14.5 02/04/2014   HCT 42.0 02/04/2014   PLT 233 02/04/2014   MCV 86.2 02/04/2014   MCH 29.8 02/04/2014   MCHC 34.5 02/04/2014   RDW 13.9 02/04/2014   LYMPHSABS 2.0 02/04/2014   MONOABS 0.3 02/04/2014   EOSABS 0.1 02/04/2014   BASOSABS 0.0 02/04/2014   BMET Lab Results  Component Value Date   NA 136 02/04/2014   K 4.3 02/04/2014   CL 100 02/04/2014   CO2 31 02/04/2014   GLUCOSE 96 02/04/2014   BUN 7 02/04/2014   CREATININE 0.76 02/04/2014   CALCIUM 9.4 02/04/2014   GFRNONAA >89 02/04/2014   GFRAA >89 02/04/2014     Assessment and Plan  hiv = poorly controlled since he is been off meds for 2 wk. He will likely get adap approval in 3 wks, and plan to restart stribild. We will see him back after he has been on meds for 4-6 wks  Health maintenance = flu shot today  Chapped hands = will try moisturizer/vaseline based creams

## 2014-08-25 ENCOUNTER — Other Ambulatory Visit: Payer: Self-pay | Admitting: *Deleted

## 2014-08-25 DIAGNOSIS — B20 Human immunodeficiency virus [HIV] disease: Secondary | ICD-10-CM

## 2014-08-25 MED ORDER — ELVITEG-COBIC-EMTRICIT-TENOFDF 150-150-200-300 MG PO TABS: 1.0000 | ORAL_TABLET | Freq: Every day | ORAL | Status: DC

## 2014-08-25 NOTE — Telephone Encounter (Signed)
[employer]AP Application 

## 2014-09-06 ENCOUNTER — Ambulatory Visit: Payer: No Typology Code available for payment source

## 2014-10-21 ENCOUNTER — Ambulatory Visit: Payer: No Typology Code available for payment source | Admitting: Internal Medicine

## 2014-12-02 ENCOUNTER — Ambulatory Visit: Payer: Self-pay | Admitting: Internal Medicine

## 2015-03-03 ENCOUNTER — Other Ambulatory Visit: Payer: Self-pay | Admitting: *Deleted

## 2015-03-03 DIAGNOSIS — Z113 Encounter for screening for infections with a predominantly sexual mode of transmission: Secondary | ICD-10-CM

## 2015-07-13 ENCOUNTER — Ambulatory Visit: Payer: Self-pay | Admitting: Internal Medicine

## 2015-12-23 ENCOUNTER — Emergency Department: Payer: Self-pay

## 2015-12-23 ENCOUNTER — Encounter: Payer: Self-pay | Admitting: Emergency Medicine

## 2015-12-23 DIAGNOSIS — J209 Acute bronchitis, unspecified: Secondary | ICD-10-CM | POA: Insufficient documentation

## 2015-12-23 DIAGNOSIS — Z87891 Personal history of nicotine dependence: Secondary | ICD-10-CM | POA: Insufficient documentation

## 2015-12-23 DIAGNOSIS — Z79899 Other long term (current) drug therapy: Secondary | ICD-10-CM | POA: Insufficient documentation

## 2015-12-23 DIAGNOSIS — Z880 Allergy status to penicillin: Secondary | ICD-10-CM | POA: Insufficient documentation

## 2015-12-23 DIAGNOSIS — Z21 Asymptomatic human immunodeficiency virus [HIV] infection status: Secondary | ICD-10-CM | POA: Insufficient documentation

## 2015-12-23 LAB — CBC WITH DIFFERENTIAL/PLATELET
Basophils Absolute: 0 10*3/uL (ref 0–0.1)
Basophils Relative: 1 %
Eosinophils Absolute: 0.2 10*3/uL (ref 0–0.7)
Eosinophils Relative: 4 %
HEMATOCRIT: 39.5 % — AB (ref 40.0–52.0)
HEMOGLOBIN: 13.4 g/dL (ref 13.0–18.0)
LYMPHS PCT: 35 %
Lymphs Abs: 2 10*3/uL (ref 1.0–3.6)
MCH: 30.3 pg (ref 26.0–34.0)
MCHC: 33.9 g/dL (ref 32.0–36.0)
MCV: 89.4 fL (ref 80.0–100.0)
MONOS PCT: 7 %
Monocytes Absolute: 0.4 10*3/uL (ref 0.2–1.0)
Neutro Abs: 3 10*3/uL (ref 1.4–6.5)
Neutrophils Relative %: 53 %
Platelets: 168 10*3/uL (ref 150–440)
RBC: 4.42 MIL/uL (ref 4.40–5.90)
RDW: 13 % (ref 11.5–14.5)
WBC: 5.6 10*3/uL (ref 3.8–10.6)

## 2015-12-23 NOTE — ED Notes (Signed)
Timothy HaJonathan pa notified of pt's chief complaint, order for cbc received.

## 2015-12-23 NOTE — ED Notes (Signed)
Pt states frequent cough for 2 days. Pt states "i get to coughing and i feel like i can't breathe". Pt speaking in full sentences, frequent cough noted in triage. Clear breath sounds in all lung fields auscultated. Pt denies known fever. Skin pwd.

## 2015-12-24 ENCOUNTER — Emergency Department
Admission: EM | Admit: 2015-12-24 | Discharge: 2015-12-24 | Disposition: A | Discharge status: Home / Self Care | Payer: Self-pay | Attending: Emergency Medicine | Admitting: Emergency Medicine

## 2015-12-24 DIAGNOSIS — J40 Bronchitis, not specified as acute or chronic: Secondary | ICD-10-CM

## 2015-12-24 MED ORDER — ALBUTEROL SULFATE HFA 108 (90 BASE) MCG/ACT IN AERS: 2.0000 | INHALATION_SPRAY | RESPIRATORY_TRACT | Status: DC | PRN

## 2015-12-24 MED ORDER — LEVOFLOXACIN 750 MG PO TABS
750.0000 mg | ORAL_TABLET | Freq: Once | ORAL | Status: AC
  Administered 2015-12-24: 750 mg via ORAL
  Filled 2015-12-24: qty 1

## 2015-12-24 MED ORDER — HYDROCOD POLST-CPM POLST ER 10-8 MG/5ML PO SUER
5.0000 mL | Freq: Once | ORAL | Status: AC
  Administered 2015-12-24: 5 mL via ORAL
  Filled 2015-12-24: qty 5

## 2015-12-24 MED ORDER — LEVOFLOXACIN 750 MG PO TABS: 750.0000 mg | ORAL_TABLET | Freq: Every day | ORAL | Status: DC

## 2015-12-24 MED ORDER — HYDROCOD POLST-CPM POLST ER 10-8 MG/5ML PO SUER: 5.0000 mL | Freq: Two times a day (BID) | ORAL | Status: DC

## 2015-12-24 NOTE — Discharge Instructions (Signed)
1. Take antibiotic as prescribed (Levaquin 750 mg daily 6 days). 2. You may take cough medicine as needed (Tussionex). 3. Use albuterol inhaler 2 puffs every 4 hours as needed for coughing spasms/wheezing. 4. Return to the ER for worsening symptoms, persistent vomiting, difficulty breathing or other concerns.  How to Use an Inhaler Proper inhaler technique is very important. Good technique ensures that the medicine reaches the lungs. Poor technique results in depositing the medicine on the tongue and back of the throat rather than in the airways. If you do not use the inhaler with good technique, the medicine will not help you. STEPS TO FOLLOW IF USING AN INHALER WITHOUT AN EXTENSION TUBE 1. Remove the cap from the inhaler. 2. If you are using the inhaler for the first time, you will need to prime it. Shake the inhaler for 5 seconds and release four puffs into the air, away from your face. Ask your health care provider or pharmacist if you have questions about priming your inhaler. 3. Shake the inhaler for 5 seconds before each breath in (inhalation). 4. Position the inhaler so that the top of the canister faces up. 5. Put your index finger on the top of the medicine canister. Your thumb supports the bottom of the inhaler. 6. Open your mouth. 7. Either place the inhaler between your teeth and place your lips tightly around the mouthpiece, or hold the inhaler 1-2 inches away from your open mouth. If you are unsure of which technique to use, ask your health care provider. 8. Breathe out (exhale) normally and as completely as possible. 9. Press the canister down with your index finger to release the medicine. 10. At the same time as the canister is pressed, inhale deeply and slowly until your lungs are completely filled. This should take 4-6 seconds. Keep your tongue down. 11. Hold the medicine in your lungs for 5-10 seconds (10 seconds is best). This helps the medicine get into the small airways of  your lungs. 12. Breathe out slowly, through pursed lips. Whistling is an example of pursed lips. 13. Wait at least 15-30 seconds between puffs. Continue with the above steps until you have taken the number of puffs your health care provider has ordered. Do not use the inhaler more than your health care provider tells you. 14. Replace the cap on the inhaler. 15. Follow the directions from your health care provider or the inhaler insert for cleaning the inhaler. STEPS TO FOLLOW IF USING AN INHALER WITH AN EXTENSION (SPACER) 1. Remove the cap from the inhaler. 2. If you are using the inhaler for the first time, you will need to prime it. Shake the inhaler for 5 seconds and release four puffs into the air, away from your face. Ask your health care provider or pharmacist if you have questions about priming your inhaler. 3. Shake the inhaler for 5 seconds before each breath in (inhalation). 4. Place the open end of the spacer onto the mouthpiece of the inhaler. 5. Position the inhaler so that the top of the canister faces up and the spacer mouthpiece faces you. 6. Put your index finger on the top of the medicine canister. Your thumb supports the bottom of the inhaler and the spacer. 7. Breathe out (exhale) normally and as completely as possible. 8. Immediately after exhaling, place the spacer between your teeth and into your mouth. Close your lips tightly around the spacer. 9. Press the canister down with your index finger to release the medicine. 10.  At the same time as the canister is pressed, inhale deeply and slowly until your lungs are completely filled. This should take 4-6 seconds. Keep your tongue down and out of the way. 11. Hold the medicine in your lungs for 5-10 seconds (10 seconds is best). This helps the medicine get into the small airways of your lungs. Exhale. 12. Repeat inhaling deeply through the spacer mouthpiece. Again hold that breath for up to 10 seconds (10 seconds is best). Exhale  slowly. If it is difficult to take this second deep breath through the spacer, breathe normally several times through the spacer. Remove the spacer from your mouth. 13. Wait at least 15-30 seconds between puffs. Continue with the above steps until you have taken the number of puffs your health care provider has ordered. Do not use the inhaler more than your health care provider tells you. 14. Remove the spacer from the inhaler, and place the cap on the inhaler. 15. Follow the directions from your health care provider or the inhaler insert for cleaning the inhaler and spacer. If you are using different kinds of inhalers, use your quick relief medicine to open the airways 10-15 minutes before using a steroid if instructed to do so by your health care provider. If you are unsure which inhalers to use and the order of using them, ask your health care provider, nurse, or respiratory therapist. If you are using a steroid inhaler, always rinse your mouth with water after your last puff, then gargle and spit out the water. Do not swallow the water. AVOID:  Inhaling before or after starting the spray of medicine. It takes practice to coordinate your breathing with triggering the spray.  Inhaling through the nose (rather than the mouth) when triggering the spray. HOW TO DETERMINE IF YOUR INHALER IS FULL OR NEARLY EMPTY You cannot know when an inhaler is empty by shaking it. A few inhalers are now being made with dose counters. Ask your health care provider for a prescription that has a dose counter if you feel you need that extra help. If your inhaler does not have a counter, ask your health care provider to help you determine the date you need to refill your inhaler. Write the refill date on a calendar or your inhaler canister. Refill your inhaler 7-10 days before it runs out. Be sure to keep an adequate supply of medicine. This includes making sure it is not expired, and that you have a spare inhaler.  SEEK  MEDICAL CARE IF:   Your symptoms are only partially relieved with your inhaler.  You are having trouble using your inhaler.  You have some increase in phlegm. SEEK IMMEDIATE MEDICAL CARE IF:   You feel little or no relief with your inhalers. You are still wheezing and are feeling shortness of breath or tightness in your chest or both.  You have dizziness, headaches, or a fast heart rate.  You have chills, fever, or night sweats.  You have a noticeable increase in phlegm production, or there is blood in the phlegm. MAKE SURE YOU:   Understand these instructions.  Will watch your condition.  Will get help right away if you are not doing well or get worse.   This information is not intended to replace advice given to you by your health care provider. Make sure you discuss any questions you have with your health care provider.   Document Released: 12/07/2000 Document Revised: 09/30/2013 Document Reviewed: 07/09/2013 Elsevier Interactive Patient Education 2016 [employer] ° °

## 2015-12-24 NOTE — ED Provider Notes (Signed)
[facility] Emergency Department Provider Note  ____________________________________________  Time seen: Approximately 1:21 AM  I have reviewed the triage vital signs and the nursing notes.   HISTORY  Chief Complaint Cough    HPI Kanden Carey is a 44 y.o. male who presents to the ED from work with a chief complaint of cough. Patient has a history of HIV 30 years; last CD4 count greater than 600 and viral load undetectable as of one year ago. He does not take prophylactic antibiotics. Complains of a dry cough for the past 2 days. States he has coughing fits and feels like he can't breathe. Also complains of nasal congestion. Denies fever, chills, chest pain, abdominal pain, nausea, vomiting, diarrhea, rash. Denies recent travel or trauma. Nothing makes his symptoms better or worse.   Past Medical History  Diagnosis Date  . HIV (human immunodeficiency virus infection) [facility])     Patient Active Problem List   Diagnosis Date Noted  . GASTROENTERITIS, ACUTE 03/05/2011  . CANDIDIASIS, ORAL 02/05/2011  . 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 PHARYNGITIS 09/18/2010  . TOBACCO USER 02/23/2010  . HIV DISEASE 02/09/2010  . 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 FACIAL RASH 02/09/2010    History reviewed. No pertinent past surgical history.  Current Outpatient Rx  Name  Route  Sig  Dispense  Refill  . elvitegravir-cobicistat-emtricitabine-tenofovir (STRIBILD) 150-150-200-300 MG TABS tablet   Oral   Take 1 tablet by mouth daily with breakfast.   30 tablet   11     Allergies Amoxicillin  History reviewed. No pertinent family history.  Social History Social History  Substance Use Topics  . Smoking status: Former Smoker    Types: Cigarettes  . Smokeless tobacco: Never Used     Comment: e-cigarettes  . Alcohol Use: No    Review of Systems Constitutional: No fever/chills Eyes: No visual changes. ENT: Positive for nasal congestion. No sore  throat. Cardiovascular: Denies chest pain. Respiratory: Positive for nonproductive cough. Denies shortness of breath. Gastrointestinal: No abdominal pain.  No nausea, no vomiting.  No diarrhea.  No constipation. Genitourinary: Negative for dysuria. Musculoskeletal: Negative for back pain. Skin: Negative for rash. Neurological: Negative for headaches, focal weakness or numbness.  10-point ROS otherwise negative.  ____________________________________________   PHYSICAL EXAM:  VITAL SIGNS: ED Triage Vitals  Enc Vitals Group     BP 12/23/15 2204 139/77 mmHg     Pulse Rate 12/23/15 2204 70     Resp 12/23/15 2204 18     Temp 12/23/15 2204 98 F (36.7 C)     Temp Source 12/23/15 2204 Oral     SpO2 12/23/15 2204 96 %     Weight 12/23/15 2204 123 lb (55.792 kg)     Height 12/23/15 2204  (1.651 m)     Head Cir --      Peak Flow --      Pain Score 12/23/15 2205 0     Pain Loc --      Pain Edu? --      Excl. in GC? --     Constitutional: Alert and oriented. Well appearing and in no acute distress. Eyes: Conjunctivae are normal. PERRL. EOMI. Head: Atraumatic. Nose: Congestion/rhinnorhea. Mouth/Throat: Mucous membranes are moist.  Oropharynx non-erythematous. Neck: No stridor.   Cardiovascular: Normal rate, regular rhythm. Grossly normal heart sounds.  Good peripheral circulation. Respiratory: Normal respiratory effort.  No retractions. Lungs CTAB. No wheezing. Gastrointestinal: Soft and nontender. No distention. No abdominal bruits. No CVA tenderness.  Musculoskeletal: No lower extremity tenderness nor edema.  No joint effusions. Neurologic:  Normal speech and language. No gross focal neurologic deficits are appreciated. No gait instability. Skin:  Skin is warm, dry and intact. No rash noted. Psychiatric: Mood and affect are normal. Speech and behavior are normal.  ____________________________________________   LABS (all labs ordered are listed, but only abnormal results  are displayed)  Labs Reviewed  CBC WITH DIFFERENTIAL/PLATELET - Abnormal; Notable for the following:    HCT 39.5 (*)    All other components within normal limits   ____________________________________________  EKG  None ____________________________________________  RADIOLOGY  Chest 2 view (view by me, interpreted per Dr. Cherly Hensenhang): No acute cardiopulmonary process seen. ____________________________________________   PROCEDURES  Procedure(s) performed: None  Critical Care performed: No  ____________________________________________   INITIAL IMPRESSION / ASSESSMENT AND PLAN / ED COURSE  Pertinent labs & imaging results that were available during my care of the patient were reviewed by me and considered in my medical decision making (see chart for details).  44 year old male with a history of HIV who presents with bronchitis. Will initiate treatment with Levaquin, Tussionex for cough and albuterol MDI as needed. Patient will follow-up with his PCP next week. Strict strict return precautions given. Patient verbalizes understanding and agrees with plan of care. ____________________________________________   FINAL CLINICAL IMPRESSION(S) / ED DIAGNOSES  Final diagnoses:  Bronchitis      Irean HongJade J Valine Drozdowski, MD 12/24/15 1238

## 2016-05-18 ENCOUNTER — Emergency Department: Payer: Self-pay

## 2016-05-18 ENCOUNTER — Encounter: Payer: Self-pay | Admitting: Emergency Medicine

## 2016-05-18 ENCOUNTER — Emergency Department
Admission: EM | Admit: 2016-05-18 | Discharge: 2016-05-18 | Disposition: A | Discharge status: Home / Self Care | Payer: Self-pay | Attending: Emergency Medicine | Admitting: Emergency Medicine

## 2016-05-18 DIAGNOSIS — Z791 Long term (current) use of non-steroidal anti-inflammatories (NSAID): Secondary | ICD-10-CM | POA: Insufficient documentation

## 2016-05-18 DIAGNOSIS — F1290 Cannabis use, unspecified, uncomplicated: Secondary | ICD-10-CM | POA: Insufficient documentation

## 2016-05-18 DIAGNOSIS — F17210 Nicotine dependence, cigarettes, uncomplicated: Secondary | ICD-10-CM | POA: Insufficient documentation

## 2016-05-18 DIAGNOSIS — Z792 Long term (current) use of antibiotics: Secondary | ICD-10-CM | POA: Insufficient documentation

## 2016-05-18 DIAGNOSIS — Y92019 Unspecified place in single-family (private) house as the place of occurrence of the external cause: Secondary | ICD-10-CM | POA: Insufficient documentation

## 2016-05-18 DIAGNOSIS — Z79899 Other long term (current) drug therapy: Secondary | ICD-10-CM | POA: Insufficient documentation

## 2016-05-18 DIAGNOSIS — B370 Candidal stomatitis: Secondary | ICD-10-CM | POA: Insufficient documentation

## 2016-05-18 DIAGNOSIS — S63501A Unspecified sprain of right wrist, initial encounter: Secondary | ICD-10-CM | POA: Insufficient documentation

## 2016-05-18 DIAGNOSIS — Y9389 Activity, other specified: Secondary | ICD-10-CM | POA: Insufficient documentation

## 2016-05-18 DIAGNOSIS — B20 Human immunodeficiency virus [HIV] disease: Secondary | ICD-10-CM | POA: Insufficient documentation

## 2016-05-18 DIAGNOSIS — W11XXXA Fall on and from ladder, initial encounter: Secondary | ICD-10-CM | POA: Insufficient documentation

## 2016-05-18 DIAGNOSIS — Y999 Unspecified external cause status: Secondary | ICD-10-CM | POA: Insufficient documentation

## 2016-05-18 MED ORDER — CLOTRIMAZOLE 10 MG MT TROC: 10.0000 mg | Freq: Every day | OROMUCOSAL | Status: DC

## 2016-05-18 MED ORDER — NAPROXEN 500 MG PO TBEC: 500.0000 mg | DELAYED_RELEASE_TABLET | Freq: Two times a day (BID) | ORAL | Status: DC

## 2016-05-18 NOTE — Discharge Instructions (Signed)
Thrush, Adult Timothy Salas, also called oral candidiasis, is a fungal infection that develops in the mouth and throat and on the tongue. It causes white patches to form on the mouth and tongue. Timothy Salas is most common in older adults, but it can occur at any age.  Many cases of thrush are mild, but this infection can also be more serious. Timothy Salas can be a recurring problem for people who have chronic illnesses or who take medicines that limit the body's ability to fight infection. Because these people have difficulty fighting infections, the fungus that causes thrush can spread throughout the body. This can cause life-threatening blood or organ infections. CAUSES  Timothy Salas is usually caused by a yeast called Candida albicans. This fungus is normally present in small amounts in the mouth and on other mucous membranes. It usually causes no harm. However, when conditions are present that allow the fungus to grow uncontrolled, it invades surrounding tissues and becomes an infection. Less often, other Candida species can also lead to thrush.  RISK FACTORS Timothy Salas is more likely to develop in the following people:  People with an impaired ability to fight infection (weakened immune system).   Older adults.   People with HIV.   People with diabetes.   People with dry mouth (xerostomia).   Pregnant women.   People with poor dental care, especially those who have false teeth.   People who use antibiotic medicines.  SIGNS AND SYMPTOMS  Timothy Salas can be a mild infection that causes no symptoms. If symptoms develop, they may include:   A burning feeling in the mouth and throat. This can occur at the start of a thrush infection.   White patches that adhere to the mouth and tongue. The tissue around the patches may be red, raw, and painful. If rubbed (during tooth brushing, for example), the patches and the tissue of the mouth may bleed easily.   A bad taste in the mouth or difficulty tasting foods.    Cottony feeling in the mouth.   Pain during eating and swallowing. DIAGNOSIS  Your health care provider can usually diagnose thrush by looking in your mouth and asking you questions about your health.  TREATMENT  Medicines that help prevent the growth of fungi (antifungals) are the standard treatment for thrush. These medicines are either applied directly to the affected area (topical) or swallowed (oral). The treatment will depend on the severity of the condition.  Mild Thrush Mild cases of thrush may clear up with the use of an antifungal mouth rinse or lozenges. Treatment usually lasts about 14 days.  Moderate to Severe Thrush  More severe thrush infections that have spread to the esophagus are treated with an oral antifungal medicine. A topical antifungal medicine may also be used.   For some severe infections, a treatment period longer than 14 days may be needed.   Oral antifungal medicines are almost never used during pregnancy because the fetus may be harmed. However, if a pregnant woman has a rare, severe thrush infection that has spread to her blood, oral antifungal medicines may be used. In this case, the risk of harm to the mother and fetus from the severe thrush infection may be greater than the risk posed by the use of antifungal medicines.  Persistent or Recurrent Thrush For cases of thrush that do not go away or keep coming back, treatment may involve the following:   Treatment may be needed twice as long as the symptoms last.   Treatment will include  both oral and topical antifungal medicines.   People with weakened immune systems can take an antifungal medicine on a continuous basis to prevent thrush infections.  It is important to treat conditions that make you more likely to get thrush, such as diabetes or HIV.  HOME CARE INSTRUCTIONS   Only take over-the-counter or prescription medicine as directed by your health care provider. Talk to your health care  provider about an over-the-counter medicine called gentian violet, which kills bacteria and fungi.   Eat plain, unflavored yogurt as directed by your health care provider. Check the label to make sure the yogurt contains live cultures. This yogurt can help healthy bacteria grow in the mouth that can stop the growth of the fungus that causes thrush.   Try these measures to help reduce the discomfort of thrush:   Drink cold liquids such as water or iced tea.   Try flavored ice treats or frozen juices.   Eat foods that are easy to swallow, such as gelatin, ice cream, or custard.   If the patches in your mouth are painful, try drinking from a straw.   Rinse your mouth several times a day with a warm saltwater rinse. You can make the saltwater mixture with 1 tsp (6 g) of salt in 8 fl oz (0.2 L) of warm water.   If you wear dentures, remove the dentures before going to bed, brush them vigorously, and soak them in a cleaning solution as directed by your health care provider.   Women who are breastfeeding should clean their nipples with an antifungal medicine as directed by their health care provider. Dry the nipples after breastfeeding. Applying lanolin-containing body lotion may help relieve nipple soreness.  SEEK MEDICAL CARE IF:  Your symptoms are getting worse or are not improving within 7 days of starting treatment.   You have symptoms of spreading infection, such as white patches on the skin outside of the mouth.   You are nursing and you have redness, burning, or pain in the nipples that is not relieved with treatment.  MAKE SURE YOU:  Understand these instructions.  Will watch your condition.  Will get help right away if you are not doing well or get worse.   This information is not intended to replace advice given to you by your health care provider. Make sure you discuss any questions you have with your health care provider.   Document Released: 09/04/2004 Document  Revised: 12/31/2014 Document Reviewed: 07/13/2013 Elsevier Interactive Patient Education 2016 [employer]  Wrist Sprain With Rehab A sprain is an injury in which a ligament that maintains the proper alignment of a joint is partially or completely torn. The ligaments of the wrist are susceptible to sprains. Sprains are classified into three categories. Grade 1 sprains cause pain, but the tendon is not lengthened. Grade 2 sprains include a lengthened ligament because the ligament is stretched or partially ruptured. With grade 2 sprains there is still function, although the function may be diminished. Grade 3 sprains are characterized by a complete tear of the tendon or muscle, and function is usually impaired. SYMPTOMS   Pain tenderness, inflammation, and/or bruising (contusion) of the injury.  A "pop" or tear felt and/or heard at the time of injury.  Decreased wrist function. CAUSES  A wrist sprain occurs when a force is placed on one or more ligaments that is greater than it/they can withstand. Common mechanisms of injury include:  Catching a ball with your hands.  Repetitive and/  or strenuous extension or flexion of the wrist. RISK INCREASES WITH:  Previous wrist injury.  Contact sports (boxing or wrestling).  Activities in which falling is common.  Poor strength and flexibility.  Improperly fitted or padded protective equipment. PREVENTION  Warm up and stretch properly before activity.  Allow for adequate recovery between workouts.  Maintain physical fitness:  Strength, flexibility, and endurance.  Cardiovascular fitness.  Protect the wrist joint by limiting its motion with the use of taping, braces, or splints.  Protect the wrist after injury for 6 to 12 months. PROGNOSIS  The prognosis for wrist sprains depends on the degree of injury. Grade 1 sprains require 2 to 6 weeks of treatment. Grade 2 sprains require 6 to 8 weeks of treatment, and grade 3 sprains require  up to 12 weeks.  RELATED COMPLICATIONS   Prolonged healing time, if improperly treated or re-injured.  Recurrent symptoms that result in a chronic problem.  Injury to nearby structures (bone, cartilage, nerves, or tendons).  Arthritis of the wrist.  Inability to compete in athletics at a high level.  Wrist stiffness or weakness.  Progression to a complete rupture of the ligament. TREATMENT  Treatment initially involves resting from any activities that aggravate the symptoms, and the use of ice and medications to help reduce pain and inflammation. Your caregiver may recommend immobilizing the wrist for a period of time in order to reduce stress on the ligament and allow for healing. After immobilization it is important to perform strengthening and stretching exercises to help regain strength and a full range of motion. These exercises may be completed at home or with a therapist. Surgery is not usually required for wrist sprains, unless the ligament has been ruptured (grade 3 sprain). MEDICATION   If pain medication is necessary, then nonsteroidal anti-inflammatory medications, such as aspirin and ibuprofen, or other minor pain relievers, such as acetaminophen, are often recommended.  Do not take pain medication for 7 days before surgery.  Prescription pain relievers may be given if deemed necessary by your caregiver. Use only as directed and only as much as you need. HEAT AND COLD  Cold treatment (icing) relieves pain and reduces inflammation. Cold treatment should be applied for 10 to 15 minutes every 2 to 3 hours for inflammation and pain and immediately after any activity that aggravates your symptoms. Use ice packs or massage the area with a piece of ice (ice massage).  Heat treatment may be used prior to performing the stretching and strengthening activities prescribed by your caregiver, physical therapist, or athletic trainer. Use a heat pack or soak your injury in warm  water. SEEK MEDICAL CARE IF:  Treatment seems to offer no benefit, or the condition worsens.  Any medications produce adverse side effects. EXERCISES RANGE OF MOTION (ROM) AND STRETCHING EXERCISES - Wrist Sprain  These exercises may help you when beginning to rehabilitate your injury. Your symptoms may resolve with or without further involvement from your physician, physical therapist or athletic trainer. While completing these exercises, remember:   Restoring tissue flexibility helps normal motion to return to the joints. This allows healthier, less painful movement and activity.  An effective stretch should be held for at least 30 seconds.  A stretch should never be painful. You should only feel a gentle lengthening or release in the stretched tissue. RANGE OF MOTION - Wrist Flexion, Active-Assisted  Extend your right / left elbow with your fingers pointing down.*  Gently pull the back of your hand towards you  until you feel a gentle stretch on the top of your forearm.  Hold this position for __________ seconds. Repeat __________ times. Complete this exercise __________ times per day.  *If directed by your physician, physical therapist or athletic trainer, complete this stretch with your elbow bent rather than extended. RANGE OF MOTION - Wrist Extension, Active-Assisted  Extend your right / left elbow and turn your palm upwards.*  Gently pull your palm/fingertips back so your wrist extends and your fingers point more toward the ground.  You should feel a gentle stretch on the inside of your forearm.  Hold this position for __________ seconds. Repeat __________ times. Complete this exercise __________ times per day. *If directed by your physician, physical therapist or athletic trainer, complete this stretch with your elbow bent, rather than extended. RANGE OF MOTION - Supination, Active  Stand or sit with your elbows at your side. Bend your right / left elbow to 90  degrees.  Turn your palm upward until you feel a gentle stretch on the inside of your forearm.  Hold this position for __________ seconds. Slowly release and return to the starting position. Repeat __________ times. Complete this stretch __________ times per day.  RANGE OF MOTION - Pronation, Active  Stand or sit with your elbows at your side. Bend your right / left elbow to 90 degrees.  Turn your palm downward until you feel a gentle stretch on the top of your forearm.  Hold this position for __________ seconds. Slowly release and return to the starting position. Repeat __________ times. Complete this stretch __________ times per day.  STRETCH - Wrist Flexion  Place the back of your right / left hand on a tabletop leaving your elbow slightly bent. Your fingers should point away from your body.  Gently press the back of your hand down onto the table by straightening your elbow. You should feel a stretch on the top of your forearm.  Hold this position for __________ seconds. Repeat __________ times. Complete this stretch __________ times per day.  STRETCH - Wrist Extension  Place your right / left fingertips on a tabletop leaving your elbow slightly bent. Your fingers should point backwards.  Gently press your fingers and palm down onto the table by straightening your elbow. You should feel a stretch on the inside of your forearm.  Hold this position for __________ seconds. Repeat __________ times. Complete this stretch __________ times per day.  STRENGTHENING EXERCISES - Wrist Sprain These exercises may help you when beginning to rehabilitate your injury. They may resolve your symptoms with or without further involvement from your physician, physical therapist or athletic trainer. While completing these exercises, remember:   Muscles can gain both the endurance and the strength needed for everyday activities through controlled exercises.  Complete these exercises as instructed by  your physician, physical therapist or athletic trainer. Progress with the resistance and repetition exercises only as your caregiver advises. STRENGTH - Wrist Flexors  Sit with your right / left forearm palm-up and fully supported. Your elbow should be resting below the height of your shoulder. Allow your wrist to extend over the edge of the surface.  Loosely holding a __________ weight or a piece of rubber exercise band/tubing, slowly curl your hand up toward your forearm.  Hold this position for __________ seconds. Slowly lower the wrist back to the starting position in a controlled manner. Repeat __________ times. Complete this exercise __________ times per day.  STRENGTH - Wrist Extensors  Sit with your right /  left forearm palm-down and fully supported. Your elbow should be resting below the height of your shoulder. Allow your wrist to extend over the edge of the surface.  Loosely holding a __________ weight or a piece of rubber exercise band/tubing, slowly curl your hand up toward your forearm.  Hold this position for __________ seconds. Slowly lower the wrist back to the starting position in a controlled manner. Repeat __________ times. Complete this exercise __________ times per day.  STRENGTH - Ulnar Deviators  Stand with a ____________________ weight in your right / left hand, or sit holding on to the rubber exercise band/tubing with your opposite arm supported.  Move your wrist so that your pinkie travels toward your forearm and your thumb moves away from your forearm.  Hold this position for __________ seconds and then slowly lower the wrist back to the starting position. Repeat __________ times. Complete this exercise __________ times per day STRENGTH - Radial Deviators  Stand with a ____________________ weight in your  right / left hand, or sit holding on to the rubber exercise band/tubing with your arm supported.  Raise your hand upward in front of you or pull up on the  rubber tubing.  Hold this position for __________ seconds and then slowly lower the wrist back to the starting position. Repeat __________ times. Complete this exercise __________ times per day. STRENGTH - Forearm Supinators  Sit with your right / left forearm supported on a table, keeping your elbow below shoulder height. Rest your hand over the edge, palm down.  Gently grip a hammer or a soup ladle.  Without moving your elbow, slowly turn your palm and hand upward to a "thumbs-up" position.  Hold this position for __________ seconds. Slowly return to the starting position. Repeat __________ times. Complete this exercise __________ times per day.  STRENGTH - Forearm Pronators  Sit with your right / left forearm supported on a table, keeping your elbow below shoulder height. Rest your hand over the edge, palm up.  Gently grip a hammer or a soup ladle.  Without moving your elbow, slowly turn your palm and hand upward to a "thumbs-up" position.  Hold this position for __________ seconds. Slowly return to the starting position. Repeat __________ times. Complete this exercise __________ times per day.  STRENGTH - Grip  Grasp a tennis ball, a dense sponge, or a large, rolled sock in your hand.  Squeeze as hard as you can without increasing any pain.  Hold this position for __________ seconds. Release your grip slowly. Repeat __________ times. Complete this exercise __________ times per day.    This information is not intended to replace advice given to you by your health care provider. Make sure you discuss any questions you have with your health care provider.   Document Released: 12/10/2005 Document Revised: 08/31/2015 Document Reviewed: 03/24/2009 Elsevier Interactive Patient Education [employer]  Your exam and x-ray are normal today. Wear the wrist splint as needed for comfort. Follow-up with Dr. Joice Lofts for continued wrist pain. You may perform the exercises and stretches  after a week. Take the prescription meds as directed.

## 2016-05-18 NOTE — ED Notes (Signed)
Cleaning ceiling fan while standing on a latter fell landing on right hip and right wrist, pain to both , sensation wnl distal to both right extremities

## 2016-05-18 NOTE — ED Notes (Signed)
Patient states he also has thrush in his mouth that he would liked checked.  He has been trying to get in with his PCP but the soonest appt they had was 2 months out.

## 2016-05-18 NOTE — ED Provider Notes (Signed)
[facility] Emergency Department Provider Note ____________________________________________  Time seen: 1332  I have reviewed the triage vital signs and the nursing notes.  HISTORY  Chief Complaint  Wrist Pain and Hip Pain   HPI Timothy Salas is a 45 y.o. male presents to the ED for evaluation of injuries sustained following a fall last night. He describes he was at home but didn't cleaning a ceiling fan when missed a step on the step-ladder that he was standing on. He describes a fall backwards on an outstretched hand. Since that time he's had pain, swelling, disability to the radial aspect of the right wrist. He is noted pain with lifting and movement. He has taken over-the-counter ibuprofen with limited benefit. He rates discomfort in his wrist about a 7/10 in triage. He also has a unrelated complaint of oral Candida, and is requesting a prescription for that.  Past Medical History  Diagnosis Date  . HIV (human immunodeficiency virus infection) [facility](HCC)     Patient Active Problem List   Diagnosis Date Noted  . GASTROENTERITIS, ACUTE 03/05/2011  . CANDIDIASIS, ORAL 02/05/2011  . 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 PHARYNGITIS 09/18/2010  . TOBACCO USER 02/23/2010  . HIV DISEASE 02/09/2010  . 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 FACIAL RASH 02/09/2010    History reviewed. No pertinent past surgical history.  Current Outpatient Rx  Name  Route  Sig  Dispense  Refill  . albuterol (PROVENTIL HFA;VENTOLIN HFA) 108 (90 Base) MCG/ACT inhaler   Inhalation   Inhale 2 puffs into the lungs every 4 (four) hours as needed for wheezing or shortness of breath.   1 Inhaler   0   . chlorpheniramine-HYDROcodone (TUSSIONEX PENNKINETIC ER) 10-8 MG/5ML SUER   Oral   Take 5 mLs by mouth 2 (two) times daily.   70 mL   0   . clotrimazole (MYCELEX) 10 MG troche   Oral   Take 1 tablet (10 mg total) by mouth 5 (five) times daily.   35 tablet   0   .  elvitegravir-cobicistat-emtricitabine-tenofovir (STRIBILD) 150-150-200-300 MG TABS tablet   Oral   Take 1 tablet by mouth daily with breakfast.   30 tablet   11   . levofloxacin (LEVAQUIN) 750 MG tablet   Oral   Take 1 tablet (750 mg total) by mouth daily.   6 tablet   0   . naproxen (EC NAPROSYN) 500 MG EC tablet   Oral   Take 1 tablet (500 mg total) by mouth 2 (two) times daily with a meal.   30 tablet   0     Allergies Amoxicillin  No family history on file.  Social History Social History  Substance Use Topics  . Smoking status: Current Every Day Smoker    Types: Cigarettes  . Smokeless tobacco: Never Used     Comment: e-cigarettes  . Alcohol Use: No   Review of Systems  Constitutional: Negative for fever. Eyes: Negative for visual changes. ENT: Negative for sore throat. Reports thrush Musculoskeletal: Negative for back pain. Right wrist pain as above. Skin: Negative for rash. Neurological: Negative for headaches, focal weakness or numbness. ____________________________________________  PHYSICAL EXAM:  VITAL SIGNS: ED Triage Vitals  Enc Vitals Group     BP 05/18/16 1211 117/73 mmHg     Pulse Rate 05/18/16 1211 91     Resp 05/18/16 1211 18     Temp 05/18/16 1211 98.2 F (36.8 C)     Temp Source 05/18/16 1211 Oral  SpO2 05/18/16 1211 100 %     Weight 05/18/16 1211 130 lb (58.968 kg)     Height 05/18/16 1211  (1.626 m)     Head Cir --      Peak Flow --      Pain Score 05/18/16 1212 7     Pain Loc --      Pain Edu? --      Excl. in GC? --     Constitutional: Alert and oriented. Well appearing and in no distress. Head: Normocephalic and atraumatic.      Eyes: Conjunctivae are normal. PERRL. Normal extraocular movements      Ears: Canals clear. TMs intact bilaterally.   Nose: No congestion/rhinorrhea.   Mouth/Throat: Mucous membranes are moist. Oral candidiasis lesions appreciated to the tongue. Uvula is midline and tonsils are  flat.  Cardiovascular: Normal distal pulses and capillary refill. Respiratory: Normal respiratory effort.  Musculoskeletal: Right wrist with some subtle swelling noted to the radial aspect distally. Patient with a mildly positive [place] on exam. He does exhibit a normal composite fist and normal grip strength bilaterally. He has increased pain with dorsiflexion and radial deviation of the right wrist. Nontender with normal range of motion in all other extremities.  Neurologic:  Normal gait without ataxia. Normal speech and language. No gross focal neurologic deficits are appreciated. Skin:  Skin is warm, dry and intact. No rash noted. ____________________________________________   RADIOLOGY  Right Wrist IMPRESSION: Negative.  I, Keary Hanak, Charlesetta Ivory, personally viewed and evaluated these images (plain radiographs) as part of my medical decision making, as well as reviewing the written report by the radiologist. ____________________________________________  PROCEDURES  Wrist cock-up Splint ____________________________________________  INITIAL IMPRESSION / ASSESSMENT AND PLAN / ED COURSE  Patient with an acute right wrist sprain without evidence of internal derangement, fracture, or dislocation. He will be discharged with a wrist cock-up splint for support. He is also provided with prescriptions for Mycelex troches, and EC Naprosyn dose as directed. He is given instruction on wrist pain and sprain management. He will be referred to orthopedics for ongoing symptom management. He is follow-up with his primary care provider for routine medical care. ____________________________________________  FINAL CLINICAL IMPRESSION(S) / ED DIAGNOSES  Final diagnoses:  Wrist sprain, right, initial encounter  Oral thrush     Lissa Hoard, PA-C 05/23/16 0023  Phineas Semen, MD 05/28/16 [phone]

## 2017-08-15 ENCOUNTER — Emergency Department
Admission: EM | Admit: 2017-08-15 | Discharge: 2017-08-15 | Disposition: A | Discharge status: Home / Self Care | Payer: Self-pay | Attending: Emergency Medicine | Admitting: Emergency Medicine

## 2017-08-15 ENCOUNTER — Emergency Department: Payer: Self-pay

## 2017-08-15 DIAGNOSIS — J189 Pneumonia, unspecified organism: Secondary | ICD-10-CM | POA: Insufficient documentation

## 2017-08-15 DIAGNOSIS — Z79899 Other long term (current) drug therapy: Secondary | ICD-10-CM | POA: Insufficient documentation

## 2017-08-15 DIAGNOSIS — E876 Hypokalemia: Secondary | ICD-10-CM | POA: Insufficient documentation

## 2017-08-15 DIAGNOSIS — F17210 Nicotine dependence, cigarettes, uncomplicated: Secondary | ICD-10-CM | POA: Insufficient documentation

## 2017-08-15 DIAGNOSIS — R0789 Other chest pain: Secondary | ICD-10-CM

## 2017-08-15 DIAGNOSIS — B20 Human immunodeficiency virus [HIV] disease: Secondary | ICD-10-CM | POA: Insufficient documentation

## 2017-08-15 DIAGNOSIS — Z21 Asymptomatic human immunodeficiency virus [HIV] infection status: Secondary | ICD-10-CM

## 2017-08-15 LAB — CBC
HCT: 43.7 % (ref 40.0–52.0)
HEMOGLOBIN: 15.1 g/dL (ref 13.0–18.0)
MCH: 31.9 pg (ref 26.0–34.0)
MCHC: 34.5 g/dL (ref 32.0–36.0)
MCV: 92.3 fL (ref 80.0–100.0)
PLATELETS: 196 10*3/uL (ref 150–440)
RBC: 4.74 MIL/uL (ref 4.40–5.90)
RDW: 13.6 % (ref 11.5–14.5)
WBC: 5.4 10*3/uL (ref 3.8–10.6)

## 2017-08-15 LAB — HEPATIC FUNCTION PANEL
ALBUMIN: 3.6 g/dL (ref 3.5–5.0)
ALT: 20 U/L (ref 17–63)
AST: 34 U/L (ref 15–41)
Alkaline Phosphatase: 78 U/L (ref 38–126)
Bilirubin, Direct: <0.1 mg/dL — ABNORMAL LOW (ref 0.1–0.5)
TOTAL PROTEIN: 8.8 g/dL — AB (ref 6.5–8.1)
Total Bilirubin: 0.6 mg/dL (ref 0.3–1.2)

## 2017-08-15 LAB — BASIC METABOLIC PANEL
ANION GAP: 9 (ref 5–15)
BUN: <5 mg/dL — ABNORMAL LOW (ref 6–20)
CO2: 21 mmol/L — ABNORMAL LOW (ref 22–32)
Calcium: 9 mg/dL (ref 8.9–10.3)
Chloride: 107 mmol/L (ref 101–111)
Creatinine, Ser: 0.84 mg/dL (ref 0.61–1.24)
GFR calc non Af Amer: >60 mL/min (ref >60)
Glucose, Bld: 126 mg/dL — ABNORMAL HIGH (ref 65–99)
Potassium: 3 mmol/L — ABNORMAL LOW (ref 3.5–5.1)
SODIUM: 137 mmol/L (ref 135–145)

## 2017-08-15 LAB — LIPASE, BLOOD: Lipase: 38 U/L (ref 11–51)

## 2017-08-15 LAB — TROPONIN I

## 2017-08-15 MED ORDER — POTASSIUM CHLORIDE CRYS ER 20 MEQ PO TBCR: 20.0000 meq | EXTENDED_RELEASE_TABLET | Freq: Every day | ORAL | 0 refills | Status: DC

## 2017-08-15 MED ORDER — DOXYCYCLINE MONOHYDRATE 100 MG PO CAPS: 100.0000 mg | ORAL_CAPSULE | Freq: Two times a day (BID) | ORAL | 0 refills | Status: AC

## 2017-08-15 MED ORDER — POTASSIUM CHLORIDE CRYS ER 20 MEQ PO TBCR
40.0000 meq | EXTENDED_RELEASE_TABLET | Freq: Once | ORAL | Status: AC
  Administered 2017-08-15: 40 meq via ORAL
  Filled 2017-08-15: qty 2

## 2017-08-15 MED ORDER — ONDANSETRON HCL 4 MG PO TABS: ORAL_TABLET | ORAL | 0 refills | Status: DC

## 2017-08-15 NOTE — ED Triage Notes (Signed)
Pt c/o waking last night with chest pain , N/Vx2 with generalized feeling of not well. Pt is in NAD on arrival, skin is warm and dry, respirations WNL.Marland Kitchen

## 2017-08-15 NOTE — ED Provider Notes (Signed)
[facility] Emergency Department Provider Note  ____________________________________________   First MD Initiated Contact with Patient 08/15/17 1410     (approximate)  I have reviewed the triage vital signs and the nursing notes.   HISTORY  Chief Complaint Chest Pain    HPI Timothy Salas is a 46 y.o. male with a history of HIV diagnosed decades ago but who has not been on any antiretroviral medication for at least 6 years.  He presents for evaluation of general malaise, nausea, vomiting, and chest pain starting during the night last night.  He states he has been worn out due to working a lot and a recent death in his family, but he became acutely ill during the night last night with several episodes of vomiting and generalized severe aching chest pain.  It was better by this morning and he went to work but the chest pain continues to bother him.  He left work to come to the emergency department for further evaluation.  Nothing in particular makes the patient's symptoms better nor worse.  The chest pain is better but still present at this time.  He has no history of trauma.  He denies respiratory difficulties and states that he only has a cough "when he smokes weed", but he states that he smokes marijuana every day.  He is an occasional tobacco smoker as well.  He does not know his last CD4 count nor viral load.  He denies recent fever/chills, night sweats, unintentional weight loss, abdominal pain, dysuria.   Past Medical History:  Diagnosis Date  . HIV (human immunodeficiency virus infection) [facility])     Patient Active Problem List   Diagnosis Date Noted  . GASTROENTERITIS, ACUTE 03/05/2011  . CANDIDIASIS, ORAL 02/05/2011  . 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 PHARYNGITIS 09/18/2010  . TOBACCO USER 02/23/2010  . HIV DISEASE 02/09/2010  . 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 FACIAL RASH 02/09/2010    History reviewed. No pertinent surgical  history.  Prior to Admission medications   Medication Sig Start Date End Date Taking? Authorizing Provider  albuterol (PROVENTIL HFA;VENTOLIN HFA) 108 (90 Base) MCG/ACT inhaler Inhale 2 puffs into the lungs every 4 (four) hours as needed for wheezing or shortness of breath. 12/24/15   Irean Hong, MD  chlorpheniramine-HYDROcodone [facility] PENNKINETIC ER) 10-8 MG/5ML SUER Take 5 mLs by mouth 2 (two) times daily. 12/24/15   Irean Hong, MD  clotrimazole (MYCELEX) 10 MG troche Take 1 tablet (10 mg total) by mouth 5 (five) times daily. 05/18/16   Menshew, Charlesetta Ivory, PA-C  doxycycline (MONODOX) 100 MG capsule Take 1 capsule (100 mg total) by mouth 2 (two) times daily. 08/15/17 08/25/17  Loleta Rose, MD  elvitegravir-cobicistat-emtricitabine-tenofovir (STRIBILD) 150-150-200-300 MG TABS tablet Take 1 tablet by mouth daily with breakfast. 08/25/14   Comer, Belia Heman, MD  levofloxacin (LEVAQUIN) 750 MG tablet Take 1 tablet (750 mg total) by mouth daily. 12/24/15   Irean Hong, MD  naproxen (EC NAPROSYN) 500 MG EC tablet Take 1 tablet (500 mg total) by mouth 2 (two) times daily with a meal. 05/18/16   Menshew, Charlesetta Ivory, PA-C  ondansetron (ZOFRAN) 4 MG tablet Take 1-2 tabs by mouth every 8 hours as needed for nausea/vomiting 08/15/17   Loleta Rose, MD  potassium chloride SA (KLOR-CON M20) 20 MEQ tablet Take 1 tablet (20 mEq total) by mouth daily. 08/15/17   Loleta Rose, MD    Allergies Amoxicillin  No family history on file.  Social History Social History  Substance Use Topics  . Smoking status: Current Every Day Smoker    Packs/day: 0.50    Types: Cigarettes  . Smokeless tobacco: Never Used     Comment: e-cigarettes  . Alcohol use Yes     Comment: occassionally    Review of Systems Constitutional: No fever/chills Eyes: No visual changes. ENT: No sore throat. Cardiovascular: Generalized severe aching chest pain Respiratory: Denies shortness of breath.  No  cough Gastrointestinal: No abdominal pain.  Several episodes of emesis last night with some persistent nausea Genitourinary: Negative for dysuria. Musculoskeletal: Negative for neck pain.  Negative for back pain. Integumentary: Negative for rash. Neurological: Negative for headaches, focal weakness or numbness.   ____________________________________________   PHYSICAL EXAM:  VITAL SIGNS: ED Triage Vitals [08/15/17 1234]  Enc Vitals Group     BP 126/77     Pulse Rate 97     Resp 16     Temp 97.8 F (36.6 C)     Temp Source Oral     SpO2 97 %     Weight 56.7 kg (125 lb)     Height 1.626 m (5\' 4" )     Head Circumference      Peak Flow      Pain Score 6     Pain Loc      Pain Edu?      Excl. in GC?     Constitutional: Alert and oriented. Well appearing and in no acute distress. Eyes: Conjunctivae are normal.  Head: Atraumatic. Nose: No congestion/rhinnorhea. Neck: No stridor.  No meningeal signs.   Cardiovascular: Normal rate, regular rhythm. Good peripheral circulation. Grossly normal heart sounds. Respiratory: Normal respiratory effort.  No retractions. Lungs CTAB. Gastrointestinal: Soft and nontender. No distention.  Musculoskeletal: No lower extremity tenderness nor edema. No gross deformities of extremities. Neurologic:  Normal speech and language. No gross focal neurologic deficits are appreciated.  Skin:  Skin is warm, dry and intact. No rash noted. Psychiatric: Mood and affect are normal. Speech and behavior are normal.  ____________________________________________   LABS (all labs ordered are listed, but only abnormal results are displayed)  Labs Reviewed  BASIC METABOLIC PANEL - Abnormal; Notable for the following:       Result Value   Potassium 3.0 (*)    CO2 21 (*)    Glucose, Bld 126 (*)    BUN <5 (*)    All other components within normal limits  HEPATIC FUNCTION PANEL - Abnormal; Notable for the following:    Total Protein 8.8 (*)    Bilirubin,  Direct <0.1 (*)    All other components within normal limits  CBC  TROPONIN I  LIPASE, BLOOD  HELPER T-LYMPH-CD4 ([facility] ONLY)  HIV-1 RNA ULTRAQUANT REFLEX TO GENTYP+   ____________________________________________  EKG  ED ECG REPORT I, Adaijah Endres, the attending physician, personally viewed and interpreted this ECG.  Date: 08/15/2017 EKG Time: 12:27 Rate: 101 Rhythm: borderline sinus tachycardia QRS Axis: normal Intervals: normal ST/T Wave abnormalities: normal Narrative Interpretation: unremarkable  ____________________________________________  RADIOLOGY Aura Camps, Julionna Marczak, personally viewed and evaluated these images (plain radiographs) as part of my medical decision making, as well as reviewing the written report by the radiologist.  Dg Chest 2 View  Result Date: 08/15/2017 CLINICAL DATA:  Chest pain.  Nausea and vomiting. EXAM: CHEST  2 VIEW COMPARISON:  12/23/2015 . FINDINGS: Mediastinum and hilar structures normal. Heart size normal. Increased basilar interstitial markings suggesting pneumonitis. Mild basilar atelectasis also  noted. Bilateral nipple shadows again noted. No pleural effusion or pneumothorax. Degenerative changes thoracic spine with thoracic spine scoliosis. IMPRESSION: Increased basilar interstitial markings. Findings consist with basilar pneumonitis. Associated mild basilar subsegmental atelectasis. Electronically Signed   By: Maisie Fus  Register   On: 08/15/2017 13:11    ____________________________________________   PROCEDURES  Critical Care performed: No   Procedure(s) performed:   Procedures   ____________________________________________   INITIAL IMPRESSION / ASSESSMENT AND PLAN / ED COURSE  Pertinent labs & imaging results that were available during my care of the patient were reviewed by me and considered in my medical decision making (see chart for details).  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  However his vital signs are generally reassuring with only borderline tachycardia and no hypoxemia.  I called and spoke by phone with Dr. Sampson Goon with infectious disease and discussed the case.  He suggested treating as an outpatient with doxycycline for community-acquired pneumonia and he agreed with that plan.  He felt that PCP pneumonia is certainly possible but agrees it is less likely given the patient has no cough and no hypoxemia.  He recommended against giving any steroids and against treating with Bactrim, at least until we get back the CD4 count or viral load.  I sent a message to Dr. Sampson Goon through [facility] and he will follow up with the patient to make sure he is seen in clinic for follow-up and to establish antiretroviral treatment and treatment of any possible acute infection beyond what doxycycline will do.  I stressed to the patient the importance of close outpatient follow-up.  I will give him a work note for a couple of days but stressed out important it is that he follow up with Dr. Sampson Goon.  I gave him a coupon to help with the cost of the doxycycline.   I gave my usual and customary return precautions.  Labs were sent for CD4 count and viral load.       ____________________________________________  FINAL CLINICAL IMPRESSION(S) / ED DIAGNOSES  Final diagnoses:  Pneumonitis  Hypokalemia  Atypical chest pain  HIV positive (HCC)     MEDICATIONS GIVEN DURING THIS VISIT:  Medications  potassium chloride SA (K-DUR,KLOR-CON) CR tablet 40 mEq (40 mEq Oral Given 08/15/17 1450)     NEW OUTPATIENT MEDICATIONS STARTED DURING THIS VISIT:  New Prescriptions   DOXYCYCLINE (MONODOX) 100 MG CAPSULE    Take 1 capsule (100 mg total) by mouth 2 (two) times daily.   ONDANSETRON (ZOFRAN) 4 MG TABLET    Take 1-2 tabs by mouth every 8 hours as needed for nausea/vomiting   POTASSIUM CHLORIDE SA (KLOR-CON M20) 20 MEQ TABLET    Take 1 tablet (20 mEq total) by mouth daily.     Modified Medications   No medications on file    Discontinued Medications   No medications on file     Note:  This document was prepared using Dragon voice recognition software and may include unintentional dictation errors.    Loleta Rose, MD 08/15/17 1537

## 2017-08-15 NOTE — ED Notes (Signed)
EKG performed, labs drawn and sent to lab. Pt escorted to waiting room.

## 2017-08-15 NOTE — Discharge Instructions (Signed)
As we discussed, your workup was generally reassuring today.  It appears she may have some inflammation or infection in your lungs based on your chest x-ray.  Given your long-term HIV status without treatment for years, however, it is critically important that you follow-up with Dr. Sampson Goon, our local infectious disease specialist.  His office should reach out to you to schedule the next available appointment, but if you have not heard from them by Friday, please call and explained that Dr. Sampson Goon said he needs to see you as soon as possible in clinic to follow-up on an emergency department visit.  In the meantime, please take the full course of prescribed antibiotics.  Dr. Sampson Goon may change your medication regimen once he sees you, but for now this may help.  Your potassium level was low likely due to your vomiting and decreased eating and drinking recently, so please take the potassium supplement as recommended.  However, if you only take one medication, please make sure it is the doxycycline.    Return to the emergency department if you develop new or worsening symptoms that concern you.

## 2017-08-16 LAB — HELPER T-LYMPH-CD4 (ARMC ONLY)
% CD 4 POS. LYMPH.: 16.4 % — AB (ref 30.8–58.5)
ABSOLUTE CD 4 HELPER: 230 /uL — AB (ref 359–1519)
BASOS ABS: 0 10*3/uL (ref 0.0–0.2)
Basos: 0 %
EOS (ABSOLUTE): 0 10*3/uL (ref 0.0–0.4)
Eos: 1 %
HEMATOCRIT: 41.5 % (ref 37.5–51.0)
Hemoglobin: 14.6 g/dL (ref 13.0–17.7)
IMMATURE GRANS (ABS): 0 10*3/uL (ref 0.0–0.1)
IMMATURE GRANULOCYTES: 0 %
LYMPHS: 37 %
Lymphocytes Absolute: 1.4 10*3/uL (ref 0.7–3.1)
MCH: 31.9 pg (ref 26.6–33.0)
MCHC: 35.2 g/dL (ref 31.5–35.7)
MCV: 91 fL (ref 79–97)
MONOCYTES: 7 %
Monocytes Absolute: 0.3 10*3/uL (ref 0.1–0.9)
NEUTROS ABS: 2.1 10*3/uL (ref 1.4–7.0)
NEUTROS PCT: 55 %
Platelets: 200 10*3/uL (ref 150–379)
RBC: 4.57 x10E6/uL (ref 4.14–5.80)
RDW: 14.5 % (ref 12.3–15.4)
WBC: 3.8 10*3/uL (ref 3.4–10.8)

## 2017-08-17 ENCOUNTER — Telehealth: Payer: Self-pay | Admitting: Infectious Diseases

## 2017-08-17 NOTE — Telephone Encounter (Signed)
Tammy This patient used to see Aram Beecham but is out of care since 2015 and off meds. He showed up in [facility] ED with chest pain.  His CD4 is > 200 and VL pending.  I did not see him but discussed his case with ED MD.  I was hoping you could reach out and see if he will be able to follow up in [place] or would rather come to the clinic here in [place]. His address is listed as [address] so not sure how he ended up in [facility].  I can certainly see him if needed THanks Theodoro Grist

## 2017-08-21 LAB — REFLEX TO GENOSURE(R) MG
HIV GENOSURE(R) MG PDF: UNDETERMINED
HIV GENOSURE(R): UNDETERMINED
HIV GENOSURE: UNDETERMINED

## 2017-08-23 NOTE — Telephone Encounter (Signed)
I will call the patient

## 2017-09-03 NOTE — Telephone Encounter (Signed)
Hi Tammy Any word from this patient. I can see in my clinic if needed.

## 2017-09-16 ENCOUNTER — Emergency Department (HOSPITAL_COMMUNITY)
Admission: EM | Admit: 2017-09-16 | Discharge: 2017-09-16 | Disposition: A | Discharge status: Home / Self Care | Payer: Self-pay | Attending: Emergency Medicine | Admitting: Emergency Medicine

## 2017-09-16 ENCOUNTER — Emergency Department (HOSPITAL_COMMUNITY): Payer: Self-pay

## 2017-09-16 ENCOUNTER — Encounter (HOSPITAL_COMMUNITY): Payer: Self-pay | Admitting: Emergency Medicine

## 2017-09-16 DIAGNOSIS — F17210 Nicotine dependence, cigarettes, uncomplicated: Secondary | ICD-10-CM | POA: Insufficient documentation

## 2017-09-16 DIAGNOSIS — Z79899 Other long term (current) drug therapy: Secondary | ICD-10-CM | POA: Insufficient documentation

## 2017-09-16 DIAGNOSIS — N201 Calculus of ureter: Secondary | ICD-10-CM

## 2017-09-16 DIAGNOSIS — B20 Human immunodeficiency virus [HIV] disease: Secondary | ICD-10-CM | POA: Insufficient documentation

## 2017-09-16 LAB — I-STAT TROPONIN, ED: Troponin i, poc: 0 ng/mL (ref 0.00–0.08)

## 2017-09-16 LAB — COMPREHENSIVE METABOLIC PANEL
ALBUMIN: 3.5 g/dL (ref 3.5–5.0)
ALT: 16 U/L — AB (ref 17–63)
AST: 29 U/L (ref 15–41)
Alkaline Phosphatase: 85 U/L (ref 38–126)
Anion gap: 7 (ref 5–15)
BUN: 5 mg/dL — AB (ref 6–20)
CHLORIDE: 104 mmol/L (ref 101–111)
CO2: 25 mmol/L (ref 22–32)
CREATININE: 1.03 mg/dL (ref 0.61–1.24)
Calcium: 9.2 mg/dL (ref 8.9–10.3)
GFR calc Af Amer: >60 mL/min (ref >60)
GLUCOSE: 118 mg/dL — AB (ref 65–99)
POTASSIUM: 3.8 mmol/L (ref 3.5–5.1)
Sodium: 136 mmol/L (ref 135–145)
Total Bilirubin: 0.8 mg/dL (ref 0.3–1.2)
Total Protein: 8.5 g/dL — ABNORMAL HIGH (ref 6.5–8.1)

## 2017-09-16 LAB — URINALYSIS, ROUTINE W REFLEX MICROSCOPIC
Bilirubin Urine: NEGATIVE
Glucose, UA: NEGATIVE mg/dL
KETONES UR: 20 mg/dL — AB
Leukocytes, UA: NEGATIVE
Nitrite: NEGATIVE
PH: 6 (ref 5.0–8.0)
PROTEIN: 30 mg/dL — AB
SQUAMOUS EPITHELIAL / LPF: NONE SEEN
Specific Gravity, Urine: 1.012 (ref 1.005–1.030)

## 2017-09-16 LAB — CBC
HCT: 43.9 % (ref 39.0–52.0)
Hemoglobin: 14.8 g/dL (ref 13.0–17.0)
MCH: 31.2 pg (ref 26.0–34.0)
MCHC: 33.7 g/dL (ref 30.0–36.0)
MCV: 92.6 fL (ref 78.0–100.0)
PLATELETS: 181 10*3/uL (ref 150–400)
RBC: 4.74 MIL/uL (ref 4.22–5.81)
RDW: 13.7 % (ref 11.5–15.5)
WBC: 8.4 10*3/uL (ref 4.0–10.5)

## 2017-09-16 LAB — I-STAT CG4 LACTIC ACID, ED: LACTIC ACID, VENOUS: 1.51 mmol/L (ref 0.5–1.9)

## 2017-09-16 MED ORDER — HYDROMORPHONE HCL 1 MG/ML IJ SOLN
1.0000 mg | Freq: Once | INTRAMUSCULAR | Status: AC
  Administered 2017-09-16: 1 mg via INTRAVENOUS
  Filled 2017-09-16: qty 1

## 2017-09-16 MED ORDER — SODIUM CHLORIDE 0.9 % IV BOLUS (SEPSIS)
1000.0000 mL | Freq: Once | INTRAVENOUS | Status: AC
  Administered 2017-09-16: 1000 mL via INTRAVENOUS

## 2017-09-16 MED ORDER — HYDROCODONE-ACETAMINOPHEN 5-325 MG PO TABS: 1.0000 | ORAL_TABLET | ORAL | 0 refills | Status: DC | PRN

## 2017-09-16 NOTE — ED Provider Notes (Signed)
[facility]-EMERGENCY DEPT Provider Note   CSN: [id number] Arrival date & time: 09/16/17  1237     History   Chief Complaint Chief Complaint  Patient presents with  . Abdominal Pain    HPI Timothy Mantz. is a 46 y.o. male.  HPI  46 year old male with a history of HIV presents with acute right sided abdominal pain. It is worst in his right lower quadrant but also some in his right upper abdomen. No significant back pain. This started about 11 AM this morning when he was driving to his grandmother's house to start doing mowing. It was severe pain on onset and seems to be a little bit better but is still quite painful. He states at first the pain was Of everywhere but now more right-sided. At first it was also some pain in his testicle on the right. However now he has no testicular pain or swelling. No dysuria or hematuria. No prior symptoms similar to this. He states about 15 years ago he had a drainage of some sort of intra-abdominal abscess but he is pretty sure he still has his appendix. No nausea, vomiting, fevers. No diarrhea. Pain is worst with sitting up, better with lying flat.  Past Medical History:  Diagnosis Date  . HIV (human immunodeficiency virus infection) [facility])     Patient Active Problem List   Diagnosis Date Noted  . GASTROENTERITIS, ACUTE 03/05/2011  . CANDIDIASIS, ORAL 02/05/2011  . 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 PHARYNGITIS 09/18/2010  . TOBACCO USER 02/23/2010  . HIV DISEASE 02/09/2010  . 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 FACIAL RASH 02/09/2010    History reviewed. No pertinent surgical history.     Home Medications    Prior to Admission medications   Medication Sig Start Date End Date Taking? Authorizing Provider  albuterol (PROVENTIL HFA;VENTOLIN HFA) 108 (90 Base) MCG/ACT inhaler Inhale 2 puffs into the lungs every 4 (four) hours as needed for wheezing or shortness of breath. 12/24/15   Irean Hong, MD  chlorpheniramine-HYDROcodone  [facility] PENNKINETIC ER) 10-8 MG/5ML SUER Take 5 mLs by mouth 2 (two) times daily. 12/24/15   Irean Hong, MD  clotrimazole (MYCELEX) 10 MG troche Take 1 tablet (10 mg total) by mouth 5 (five) times daily. 05/18/16   Menshew, Charlesetta Ivory, PA-C  elvitegravir-cobicistat-emtricitabine-tenofovir (STRIBILD) 150-150-200-300 MG TABS tablet Take 1 tablet by mouth daily with breakfast. 08/25/14   Comer, Belia Heman, MD  HYDROcodone-acetaminophen (NORCO) 5-325 MG tablet Take 1-2 tablets by mouth every 4 (four) hours as needed for severe pain. 09/16/17   Pricilla Loveless, MD  levofloxacin (LEVAQUIN) 750 MG tablet Take 1 tablet (750 mg total) by mouth daily. 12/24/15   Irean Hong, MD  naproxen (EC NAPROSYN) 500 MG EC tablet Take 1 tablet (500 mg total) by mouth 2 (two) times daily with a meal. 05/18/16   Menshew, Charlesetta Ivory, PA-C  ondansetron (ZOFRAN) 4 MG tablet Take 1-2 tabs by mouth every 8 hours as needed for nausea/vomiting 08/15/17   Loleta Rose, MD  potassium chloride SA (KLOR-CON M20) 20 MEQ tablet Take 1 tablet (20 mEq total) by mouth daily. 08/15/17   Loleta Rose, MD    Family History History reviewed. No pertinent family history.  Social History Social History  Substance Use Topics  . Smoking status: Current Every Day Smoker    Packs/day: 0.50    Types: Cigarettes  . Smokeless tobacco: Never Used     Comment: e-cigarettes  . Alcohol use Yes  Comment: occassionally     Allergies   Amoxicillin   Review of Systems Review of Systems  Constitutional: Negative for fever.  Gastrointestinal: Positive for abdominal pain. Negative for diarrhea, nausea and vomiting.  Genitourinary: Negative for dysuria and hematuria.  Musculoskeletal: Negative for back pain.  All other systems reviewed and are negative.    Physical Exam Updated Vital Signs BP (!) 148/74 (BP Location: Right Arm)   Pulse 94   Temp 98.5 F (36.9 C) (Oral)   Resp 20   SpO2 100%   Physical Exam    Constitutional: He is oriented to person, place, and time. He appears well-developed and well-nourished.  HENT:  Head: Normocephalic and atraumatic.  Right Ear: External ear normal.  Left Ear: External ear normal.  Nose: Nose normal.  Eyes: Right eye exhibits no discharge. Left eye exhibits no discharge.  Neck: Neck supple.  Cardiovascular: Normal rate, regular rhythm and normal heart sounds.   Pulmonary/Chest: Effort normal and breath sounds normal.  Abdominal: Soft. There is tenderness in the right lower quadrant. There is CVA tenderness (mild, right).  Genitourinary: Testes normal and penis normal. Right testis shows no swelling and no tenderness. Left testis shows no swelling and no tenderness. Circumcised.  Musculoskeletal: He exhibits no edema.  Neurological: He is alert and oriented to person, place, and time.  Skin: Skin is warm and dry. He is not diaphoretic.  Nursing note and vitals reviewed.    ED Treatments / Results  Labs (all labs ordered are listed, but only abnormal results are displayed) Labs Reviewed  COMPREHENSIVE METABOLIC PANEL - Abnormal; Notable for the following:       Result Value   Glucose, Bld 118 (*)    BUN 5 (*)    Total Protein 8.5 (*)    ALT 16 (*)    All other components within normal limits  URINALYSIS, ROUTINE W REFLEX MICROSCOPIC - Abnormal; Notable for the following:    APPearance HAZY (*)    Hgb urine dipstick LARGE (*)    Ketones, ur 20 (*)    Protein, ur 30 (*)    Bacteria, UA RARE (*)    All other components within normal limits  CBC  I-STAT CG4 LACTIC ACID, ED  I-STAT TROPONIN, ED    EKG  EKG Interpretation None       Radiology Ct Renal Stone Study  Result Date: 09/16/2017 CLINICAL DATA:  Flank pain, right lower quadrant pain EXAM: CT ABDOMEN AND PELVIS WITHOUT CONTRAST TECHNIQUE: Multidetector CT imaging of the abdomen and pelvis was performed following the standard protocol without IV contrast. COMPARISON:  None.  FINDINGS: Lower chest: Patchy dependent atelectasis. No acute consolidation or effusion. Normal heart size. Mild circumferential wall thickening of the distal esophagus. Hepatobiliary: No focal liver abnormality is seen. No gallstones, gallbladder wall thickening, or biliary dilatation. Pancreas: Unremarkable. No pancreatic ductal dilatation or surrounding inflammatory changes. Spleen: Normal in size without focal abnormality. Adrenals/Urinary Tract: Adrenal glands are within normal limits. Right perinephric fat stranding. Mild to moderate right hydronephrosis and hydroureter, secondary to a 3 x 4 mm stone in the distal right ureter at the UVJ. Bladder otherwise normal Stomach/Bowel: Stomach is within normal limits. Appendix slightly enlarged at 8 mm but no surrounding inflammation No evidence of bowel wall thickening, distention, or inflammatory changes. Vascular/Lymphatic: Aortic atherosclerosis. No enlarged abdominal or pelvic lymph nodes. Reproductive: Prostate calcification.  Slight enlargement Other: Negative for free air or free fluid. Small fat in the umbilicus Musculoskeletal: Degenerative changes. No  acute or suspicious bone lesion IMPRESSION: 1. Mild to moderate right hydronephrosis and hydroureter, secondary to a 3 x 4 mm stone in the distal right ureter at the UVJ 2. Appendix slightly enlarged in size but no inflammation. 3. Mild circumferential wall thickening of the distal esophagus, could be due to inflammation or reflux, endoscopy follow-up as indicated. Electronically Signed   By: Jasmine Pang M.D.   On: 09/16/2017 18:08    Procedures Procedures (including critical care time)  Medications Ordered in ED Medications  HYDROmorphone (DILAUDID) injection 1 mg (1 mg Intravenous Given 09/16/17 1657)  sodium chloride 0.9 % bolus 1,000 mL (0 mLs Intravenous Stopped 09/16/17 1847)     Initial Impression / Assessment and Plan / ED Course  I have reviewed the triage vital signs and the nursing  notes.  Pertinent labs & imaging results that were available during my care of the patient were reviewed by me and considered in my medical decision making (see chart for details).     Patient's acute pain appears to be coming from a distal right ureteral stone. No signs of acute infection or other acute combination. His appendix is slightly enlarged but there is no inflammatory change and his dentition is more consistent with a stone. I highly doubt concomitant appendicitis. After initial dose of pain medicine he is now resting comfortably and drinking without difficulty. Urine shows blood but no signs of UTI. No vomiting. Appears stable for discharge home. He does have a history of HIV and I have encouraged that he needs to see an infectious disease specialist because he states he has not seen his in a while and does not currently take medicines. He does not want a refill of his meds when offered but states he will see his new ID doctor first. Discussed using NSAIDs and then hydrocodone for breakthrough pain. Follow-up with urology. Discussed return precautions.  Final Clinical Impressions(s) / ED Diagnoses   Final diagnoses:  Right ureteral stone    New Prescriptions Discharge Medication List as of 09/16/2017  7:44 PM    START taking these medications   Details  HYDROcodone-acetaminophen (NORCO) 5-325 MG tablet Take 1-2 tablets by mouth every 4 (four) hours as needed for severe pain., Starting Mon 09/16/2017, Print         Pricilla Loveless, MD 09/16/17 2101

## 2017-09-16 NOTE — ED Triage Notes (Signed)
Pt here with c/o right lower quadrant pain that started this am , no n/v /d ,

## 2017-12-29 IMAGING — CT CT RENAL STONE PROTOCOL
1 of 2 series · 15 of 32 positions shown, 19 images · non-contrast
Comparison: None.

CLINICAL DATA: Flank pain, right lower quadrant pain

EXAM:
CT ABDOMEN AND PELVIS WITHOUT CONTRAST
TECHNIQUE: Multidetector CT imaging of the abdomen and pelvis was performed
following the standard protocol without IV contrast.

[Series 3: stone study 5.0 i30f 2 · axial · 0.63mm/px · z∈[-369,+41]mm · 15 of 90 slices shown, 19 images]
[im 4/90  soft-tissue]
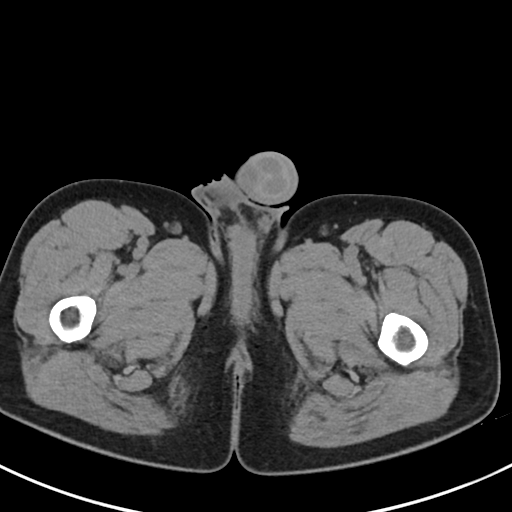
[im 4/90  bone]
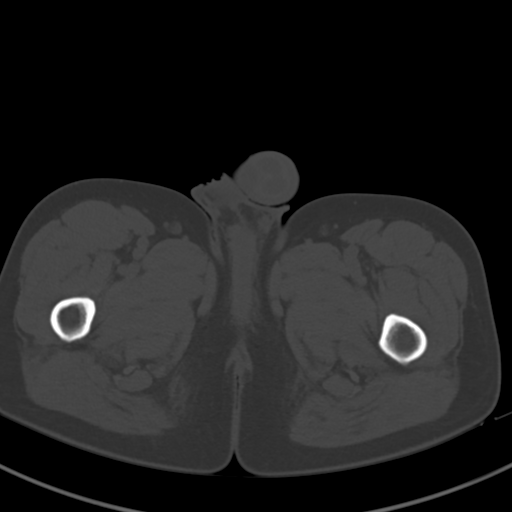
[im 11/90  soft-tissue]
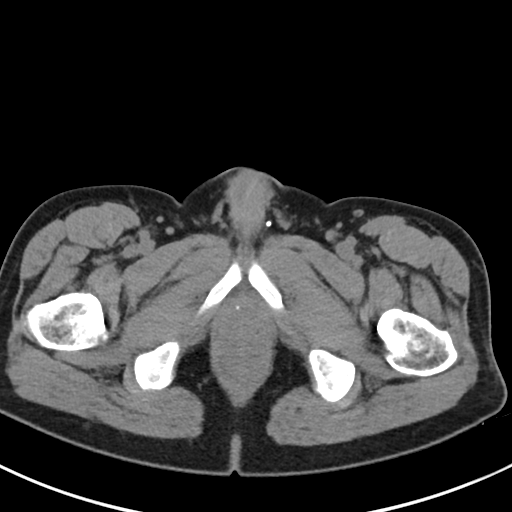
[im 18/90  soft-tissue]
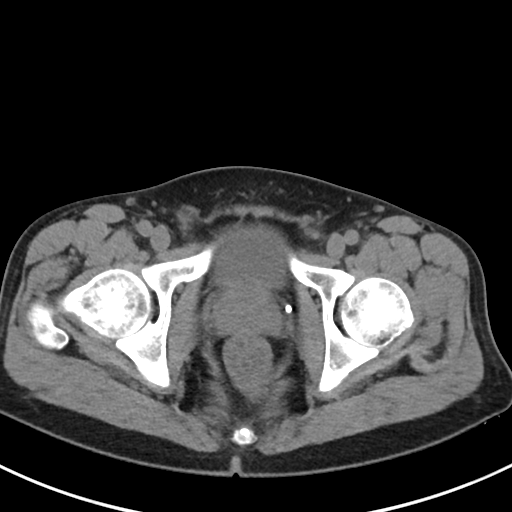
[im 25/90  soft-tissue]
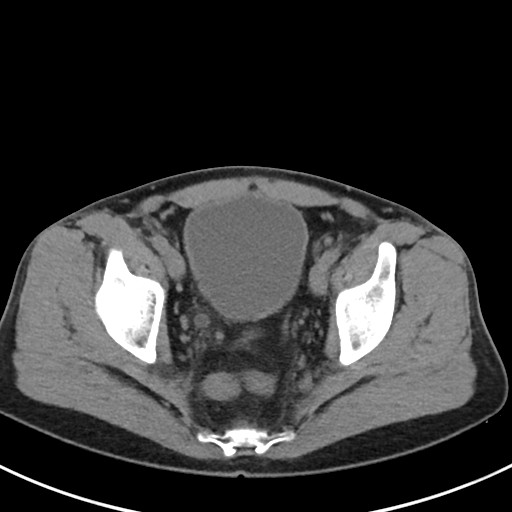
[im 33/90  soft-tissue]
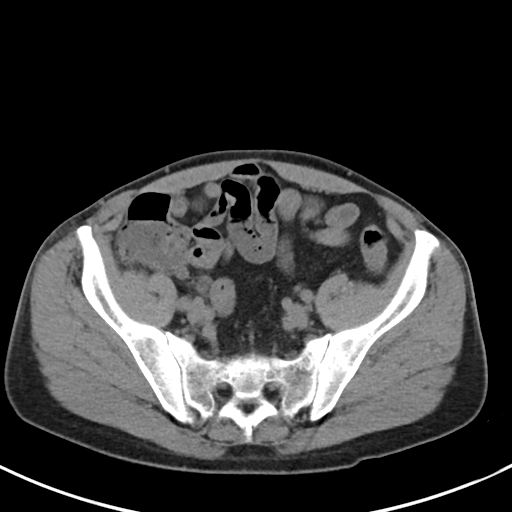
[im 40/90  soft-tissue]
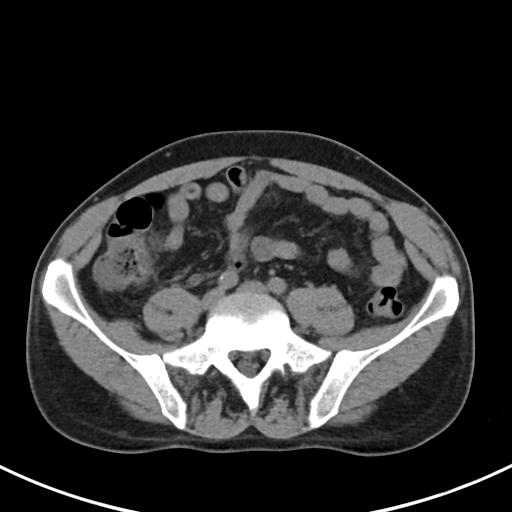
[im 47/90  soft-tissue]
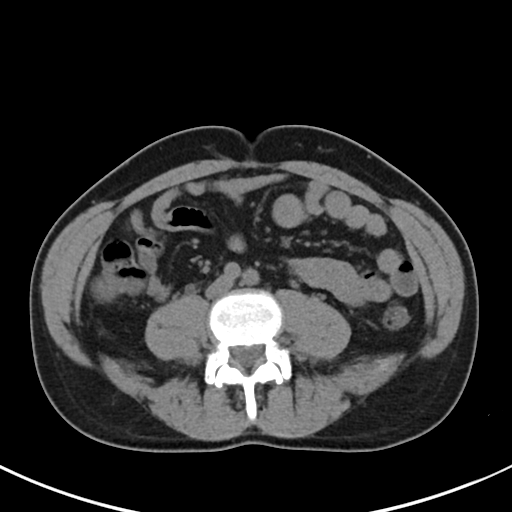
[im 50/90  soft-tissue]
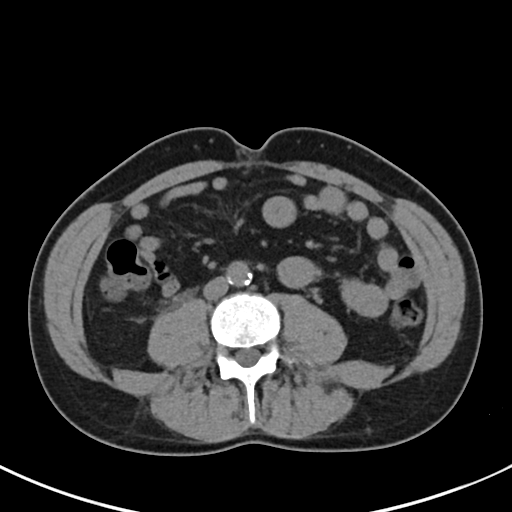
[im 57/90  soft-tissue]
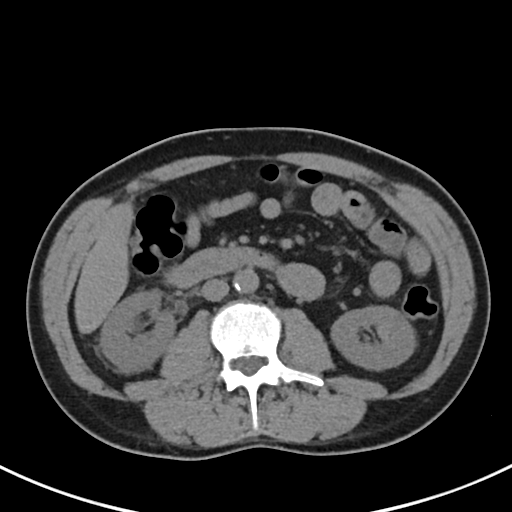
[im 57/90  bone]
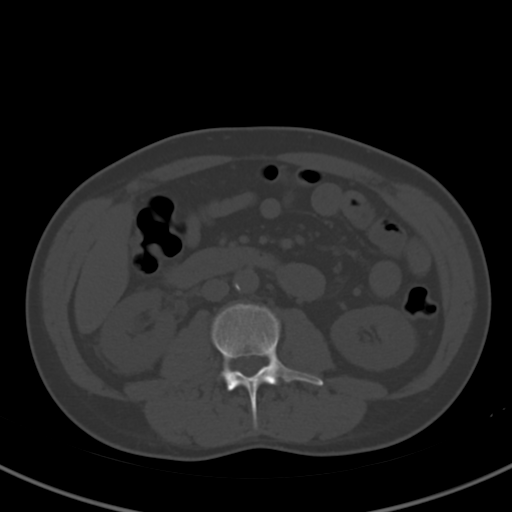
[im 65/90  soft-tissue]
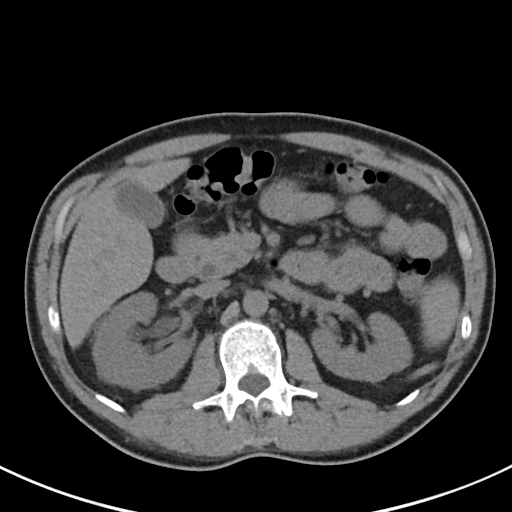
[im 72/90  soft-tissue]
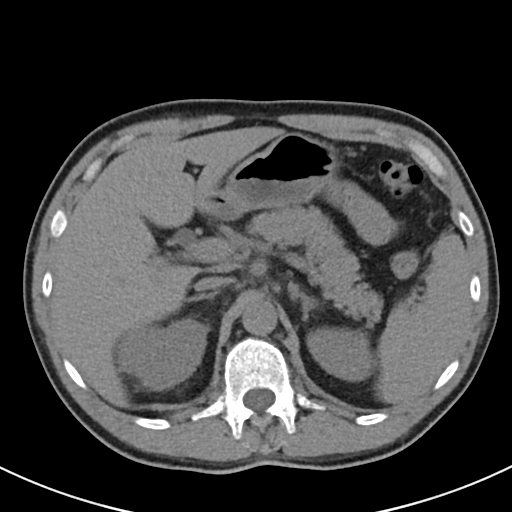
[im 75/90  lung]
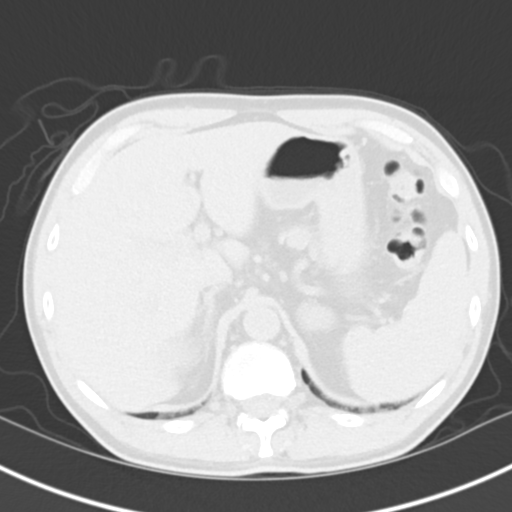
[im 79/90  soft-tissue]
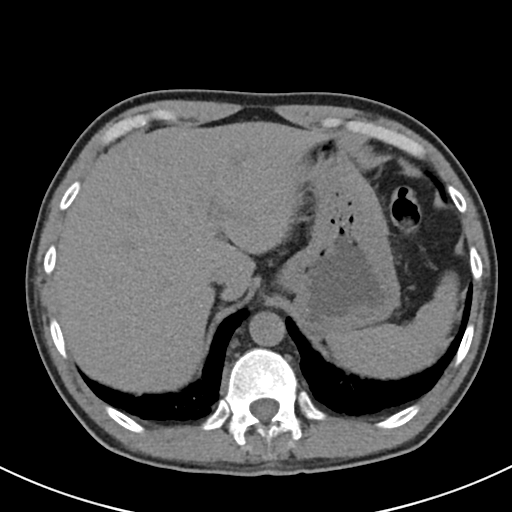
[im 79/90  lung]
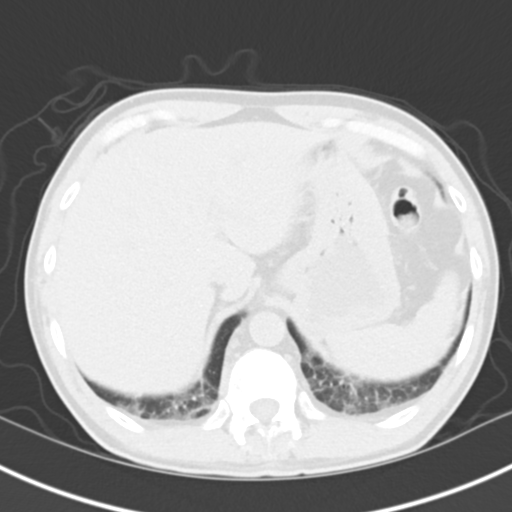
[im 82/90  lung]
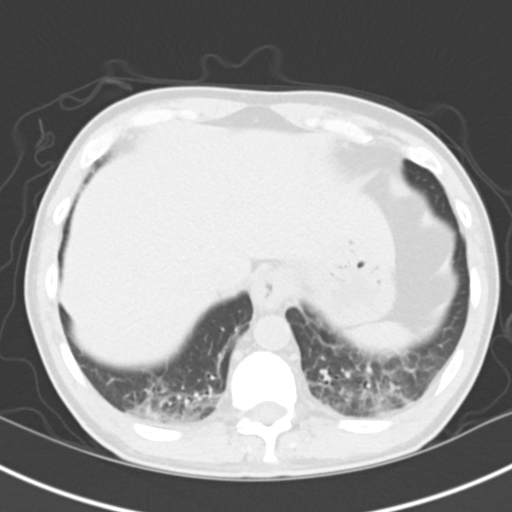
[im 86/90  soft-tissue]
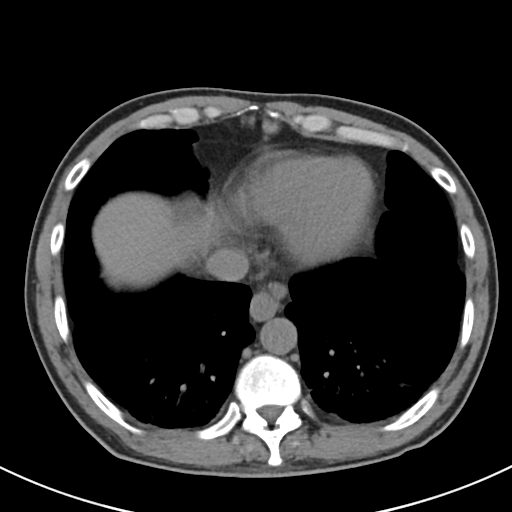
[im 86/90  lung]
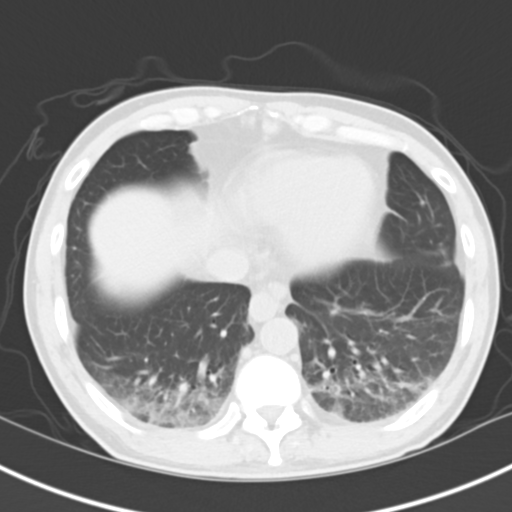

[15 of 32 positions shown; findings below may reference images not displayed]

FINDINGS: Lower chest: Patchy dependent atelectasis. No acute consolidation or
effusion. Normal heart size. Mild circumferential wall thickening of
the distal esophagus.

Hepatobiliary: No focal liver abnormality is seen. No gallstones,
gallbladder wall thickening, or biliary dilatation.

Pancreas: Unremarkable. No pancreatic ductal dilatation or
surrounding inflammatory changes.

Spleen: Normal in size without focal abnormality.

Adrenals/Urinary Tract: Adrenal glands are within normal limits.
Right perinephric fat stranding. Mild to moderate right
hydronephrosis and hydroureter, secondary to a 3 x 4 mm stone in the
distal right ureter at the UVJ. Bladder otherwise normal

Stomach/Bowel: Stomach is within normal limits. Appendix slightly
enlarged at 8 mm but no surrounding inflammation No evidence of
bowel wall thickening, distention, or inflammatory changes.

Vascular/Lymphatic: Aortic atherosclerosis. No enlarged abdominal or
pelvic lymph nodes.

Reproductive: Prostate calcification.  Slight enlargement

Other: Negative for free air or free fluid. Small fat in the
umbilicus

Musculoskeletal: Degenerative changes. No acute or suspicious bone
lesion
IMPRESSION: 1. Mild to moderate right hydronephrosis and hydroureter, secondary
to a 3 x 4 mm stone in the distal right ureter at the UVJ
2. Appendix slightly enlarged in size but no inflammation.
3. Mild circumferential wall thickening of the distal esophagus,
could be due to inflammation or reflux, endoscopy follow-up as
indicated.

## 2018-10-29 ENCOUNTER — Telehealth: Payer: Self-pay | Admitting: *Deleted

## 2018-10-29 NOTE — Telephone Encounter (Signed)
Called pt to inform him that his age ruled him out for the ldct screening program at this time.  Voiced understanding

## 2020-08-10 ENCOUNTER — Ambulatory Visit
Admission: RE | Admit: 2020-08-10 | Discharge: 2020-08-10 | Disposition: A | Discharge status: Home / Self Care | Payer: Self-pay | Source: Ambulatory Visit | Attending: Physician Assistant | Admitting: Physician Assistant

## 2020-08-10 ENCOUNTER — Ambulatory Visit
Admission: RE | Admit: 2020-08-10 | Discharge: 2020-08-10 | Disposition: A | Discharge status: Home / Self Care | Payer: Self-pay | Attending: Physician Assistant | Admitting: Physician Assistant

## 2020-08-10 ENCOUNTER — Other Ambulatory Visit: Payer: Self-pay

## 2020-08-10 ENCOUNTER — Other Ambulatory Visit: Payer: Self-pay | Admitting: Physician Assistant

## 2020-08-10 DIAGNOSIS — R0602 Shortness of breath: Secondary | ICD-10-CM | POA: Insufficient documentation

## 2023-06-03 ENCOUNTER — Emergency Department
Admission: EM | Admit: 2023-06-03 | Discharge: 2023-06-03 | Disposition: A | Discharge status: Home / Self Care | Payer: Self-pay | Attending: Emergency Medicine | Admitting: Emergency Medicine

## 2023-06-03 ENCOUNTER — Other Ambulatory Visit: Payer: Self-pay

## 2023-06-03 ENCOUNTER — Emergency Department: Payer: Self-pay

## 2023-06-03 ENCOUNTER — Encounter: Payer: Self-pay | Admitting: Emergency Medicine

## 2023-06-03 DIAGNOSIS — Z21 Asymptomatic human immunodeficiency virus [HIV] infection status: Secondary | ICD-10-CM | POA: Insufficient documentation

## 2023-06-03 DIAGNOSIS — Y92009 Unspecified place in unspecified non-institutional (private) residence as the place of occurrence of the external cause: Secondary | ICD-10-CM | POA: Insufficient documentation

## 2023-06-03 DIAGNOSIS — S20212A Contusion of left front wall of thorax, initial encounter: Secondary | ICD-10-CM | POA: Insufficient documentation

## 2023-06-03 DIAGNOSIS — W1789XA Other fall from one level to another, initial encounter: Secondary | ICD-10-CM | POA: Insufficient documentation

## 2023-06-03 LAB — URINALYSIS, ROUTINE W REFLEX MICROSCOPIC
Bacteria, UA: NONE SEEN
Bilirubin Urine: NEGATIVE
Glucose, UA: NEGATIVE mg/dL
Hgb urine dipstick: NEGATIVE
Ketones, ur: 20 mg/dL — AB
Leukocytes,Ua: NEGATIVE
Nitrite: NEGATIVE
Protein, ur: 30 mg/dL — AB
Specific Gravity, Urine: 1.029 (ref 1.005–1.030)
Squamous Epithelial / HPF: NONE SEEN /HPF (ref 0–5)
pH: 5 (ref 5.0–8.0)

## 2023-06-03 MED ORDER — OXYCODONE-ACETAMINOPHEN 5-325 MG PO TABS
1.0000 | ORAL_TABLET | Freq: Once | ORAL | Status: AC
  Administered 2023-06-03: 1 via ORAL
  Filled 2023-06-03: qty 1

## 2023-06-03 MED ORDER — OXYCODONE-ACETAMINOPHEN 5-325 MG PO TABS: 1.0000 | ORAL_TABLET | ORAL | 0 refills | Status: AC | PRN

## 2023-06-03 NOTE — ED Notes (Signed)
See triage note  Presents with pain to left rib area  States he fell last pm  Having increased pain with inspiration or cough

## 2023-06-03 NOTE — ED Triage Notes (Signed)
PT states he fell last night around 4 ft hitting left rib/flank area on coffee table. Has had pain since then. Pt states also having bloody urine and pain when he coughs.

## 2023-06-03 NOTE — ED Provider Notes (Signed)
[facility] Provider Note    Event Date/Time   First MD Initiated Contact with Patient 06/03/23 1630     (approximate)   History   Rib Injury   HPI  Timothy Salas. is a 52 y.o. male with history of HIV presents emergency department after a fall last night.  Patient was putting up security cameras in the house when he fell landing on his left side onto a coffee table.  Patient having pain in the left ribs.  Also had blood in his urine this morning.  No fever or chills.  No LOC.  States it hurts to take a deep breath.  Denies abdominal pain      Physical Exam   Triage Vital Signs: ED Triage Vitals [06/03/23 1536]  Enc Vitals Group     BP (!) 159/104     Pulse Rate (!) 102     Resp 20     Temp 98.5 F (36.9 C)     Temp Source Oral     SpO2 93 %     Weight 170 lb (77.1 kg)     Height 5\' 5"  (1.651 m)     Head Circumference      Peak Flow      Pain Score 9     Pain Loc      Pain Edu?      Excl. in GC?     Most recent vital signs: Vitals:   06/03/23 1536  BP: (!) 159/104  Pulse: (!) 102  Resp: 20  Temp: 98.5 F (36.9 C)  SpO2: 93%     General: Awake, no distress.   CV:  Good peripheral perfusion. regular rate and  rhythm Resp:  Normal effort. Lungs cta, left ribs are bruised tender to palpation Abd:  No distention.  Nontender Other:      ED Results / Procedures / Treatments   Labs (all labs ordered are listed, but only abnormal results are displayed) Labs Reviewed  URINALYSIS, ROUTINE W REFLEX MICROSCOPIC - Abnormal; Notable for the following components:      Result Value   Color, Urine AMBER (*)    APPearance HAZY (*)    Ketones, ur 20 (*)    Protein, ur 30 (*)    All other components within normal limits     EKG     RADIOLOGY X-ray of the left ribs    PROCEDURES:   Procedures   MEDICATIONS ORDERED IN ED: Medications  oxyCODONE-acetaminophen (PERCOCET/ROXICET) 5-325 MG per tablet 1 tablet (1  tablet Oral Given 06/03/23 1659)     IMPRESSION / MDM / ASSESSMENT AND PLAN / ED COURSE  I reviewed the triage vital signs and the nursing notes.                              Differential diagnosis includes, but is not limited to, fracture, contusion, kidney injury  Patient's presentation is most consistent with acute presentation with potential threat to life or bodily function.   X-ray of the left ribs, UA  Xray left ribs independently reviewed and interpreted by me as being neg for fracture  Ua is reassuring  Explained the findings to the patient, since it is more of a contusion and he does not have blood in the urine, I don't feel he needs further imaging.  Will give him a rx for pain medication, strict instructions to return if worsening, Patient  is in agreement with the treatment plan, he was discharged in stable condition     FINAL CLINICAL IMPRESSION(S) / ED DIAGNOSES   Final diagnoses:  Rib contusion, left, initial encounter     Rx / DC Orders   ED Discharge Orders          Ordered    oxyCODONE-acetaminophen (PERCOCET) 5-325 MG tablet  Every 4 hours PRN        06/03/23 1734             Note:  This document was prepared using Dragon voice recognition software and may include unintentional dictation errors.    Faythe Ghee, PA-C 06/03/23 1738    Pilar Jarvis, MD 06/03/23 [phone]

## 2023-06-03 NOTE — Discharge Instructions (Signed)
Follow-up with your regular doctor if not improving in 3 days.  Return if worsening. ?

## 2023-06-03 NOTE — ED Provider Triage Note (Signed)
Emergency Medicine Provider Triage Evaluation Note  Timothy Salas. , a 52 y.o. male  was evaluated in triage.  Pt complains of left rib pain after 4 foot fall last night while installing a security camera. No loss of consciousness, but hit forehead. Pain with cough and movement.  Physical Exam  BP (!) 159/104 (BP Location: Left Arm)   Pulse (!) 102   Temp 98.5 F (36.9 C) (Oral)   Resp 20   Ht 5\' 5"  (1.651 m)   Wt 77.1 kg   SpO2 93%   BMI 28.29 kg/m  Gen:   Awake, no distress   Resp:  Normal effort Breath sounds clear MSK:   Moves extremities without difficulty  Other:  Focal tenderness left rib area.  Medical Decision Making  Medically screening exam initiated at 3:36 PM.  Appropriate orders placed.  Timothy Salas. was informed that the remainder of the evaluation will be completed by another provider, this initial triage assessment does not replace that evaluation, and the importance of remaining in the ED until their evaluation is complete.    Chinita Pester, FNP 06/03/23 1539

## 2023-12-25 DIAGNOSIS — K831 Obstruction of bile duct: Secondary | ICD-10-CM

## 2023-12-25 HISTORY — DX: Obstruction of bile duct: K83.1

## 2024-04-22 ENCOUNTER — Emergency Department
Admission: EM | Admit: 2024-04-22 | Discharge: 2024-04-22 | Disposition: A | Discharge status: Home / Self Care | Payer: Self-pay | Attending: Emergency Medicine | Admitting: Emergency Medicine

## 2024-04-22 ENCOUNTER — Encounter: Payer: Self-pay | Admitting: *Deleted

## 2024-04-22 ENCOUNTER — Other Ambulatory Visit: Payer: Self-pay

## 2024-04-22 DIAGNOSIS — M79601 Pain in right arm: Secondary | ICD-10-CM | POA: Insufficient documentation

## 2024-04-22 MED ORDER — CYCLOBENZAPRINE HCL 10 MG PO TABS
10.0000 mg | ORAL_TABLET | Freq: Once | ORAL | Status: AC
  Administered 2024-04-22: 10 mg via ORAL
  Filled 2024-04-22: qty 1

## 2024-04-22 MED ORDER — CYCLOBENZAPRINE HCL 10 MG PO TABS: 10.0000 mg | ORAL_TABLET | Freq: Three times a day (TID) | ORAL | 0 refills | Status: DC | PRN

## 2024-04-22 MED ORDER — IBUPROFEN 600 MG PO TABS
600.0000 mg | ORAL_TABLET | Freq: Once | ORAL | Status: AC
  Administered 2024-04-22: 600 mg via ORAL
  Filled 2024-04-22: qty 1

## 2024-04-22 MED ORDER — IBUPROFEN 600 MG PO TABS: 600.0000 mg | ORAL_TABLET | Freq: Four times a day (QID) | ORAL | 0 refills | Status: DC | PRN

## 2024-04-22 NOTE — ED Triage Notes (Signed)
 Pt ambulatory to triage.  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   Pain started at wrist/forearm and then up to right upper arm.  No chest pain or sob.  Pt alert speech clear.

## 2024-04-22 NOTE — ED Provider Notes (Signed)
 [facility] Provider Note    Event Date/Time   First MD Initiated Contact with Patient 04/22/24 [phone]     (approximate)   History   Arm Pain   HPI  Timothy Salas. is a 53 y.o. male with a history of HIV who presents with right arm pain, acute onset last night, initially mainly in the wrist and now spreading up the arm.  It is not associated with chest pain.  The patient denies any numbness or weakness.  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  Denies any trauma.  I reviewed the past medical records.  The patient's most recent outpatient encounter that I have access to was at [facility] pulmonary in 2023 for lung cancer screening.   Physical Exam   Triage Vital Signs: ED Triage Vitals  Encounter Vitals Group     BP 04/22/24 0234 (!) 156/103     Systolic BP Percentile --      Diastolic BP Percentile --      Pulse Rate 04/22/24 0234 87     Resp 04/22/24 0234 16     Temp 04/22/24 0234 97.7 F (36.5 C)     Temp Source 04/22/24 0234 Oral     SpO2 04/22/24 0234 96 %     Weight 04/22/24 0234 170 lb (77.1 kg)     Height 04/22/24 0234 5\' 4"  (1.626 m)     Head Circumference --      Peak Flow --      Pain Score 04/22/24 0250 7     Pain Loc --      Pain Education --      Exclude from Growth Chart --     Most recent vital signs: Vitals:   04/22/24 0234 04/22/24 0540  BP: (!) 156/103 134/87  Pulse: 87 77  Resp: 16   Temp: 97.7 F (36.5 C)   SpO2: 96% 97%     General: Awake, no distress.  CV:  Good peripheral perfusion.  Resp:  Normal effort.  Abd:  No distention.  Other:  Right arm with full range of motion at the fingers, wrist, elbow, and shoulder.  Motor and sensory intact in median, radial, and ulnar distributions.  No swelling.  No tenderness.  No erythema or induration.  2+ radial pulse.  Normal cap refill distally.   ED Results  / Procedures / Treatments   Labs (all labs ordered are listed, but only abnormal results are displayed) Labs Reviewed - No data to display   EKG    RADIOLOGY    PROCEDURES:  Critical Care performed: No  Procedures   MEDICATIONS ORDERED IN ED: Medications  cyclobenzaprine (FLEXERIL) tablet 10 mg (has no administration in time range)  ibuprofen (ADVIL) tablet 600 mg (has no administration in time range)     IMPRESSION / MDM / ASSESSMENT AND PLAN / ED COURSE  I reviewed the triage vital signs and the nursing notes.  53 year old male with PMH as noted above presents with atraumatic right arm pain.  Exam reveals no concerning acute findings.  He has good range of motion, and the arm is neuro/vascular intact.  Differential diagnosis includes, but is not limited to, muscle strain or spasm, repetitive use injury, radiculopathy, peripheral neuropathy.  There is no indication for any ED workup.  The patient is stable for discharge home.  I prescribed ibuprofen  and Flexeril for symptomatic treatment.  He will follow-up with his primary care provider.  Return precautions given, and he expresses understanding.  Patient's presentation is most consistent with acute, uncomplicated illness.    FINAL CLINICAL IMPRESSION(S) / ED DIAGNOSES   Final diagnoses:  Right arm pain     Rx / DC Orders   ED Discharge Orders          Ordered    ibuprofen (ADVIL) 600 MG tablet  Every 6 hours PRN        04/22/24 0559    cyclobenzaprine (FLEXERIL) 10 MG tablet  3 times daily PRN        04/22/24 0559             Note:  This document was prepared using Dragon voice recognition software and may include unintentional dictation errors.    Lind Repine, MD 04/22/24 [phone]

## 2024-07-14 ENCOUNTER — Emergency Department: Payer: Self-pay

## 2024-07-14 ENCOUNTER — Encounter: Payer: Self-pay | Admitting: Emergency Medicine

## 2024-07-14 ENCOUNTER — Other Ambulatory Visit: Payer: Self-pay

## 2024-07-14 ENCOUNTER — Inpatient Hospital Stay
Admission: EM | Admit: 2024-07-14 | Discharge: 2024-07-20 | DRG: 445 | Disposition: A | Discharge status: Home / Self Care | Payer: Self-pay | Attending: Internal Medicine | Admitting: Internal Medicine

## 2024-07-14 DIAGNOSIS — B20 Human immunodeficiency virus [HIV] disease: Secondary | ICD-10-CM | POA: Diagnosis present

## 2024-07-14 DIAGNOSIS — K831 Obstruction of bile duct: Secondary | ICD-10-CM | POA: Diagnosis present

## 2024-07-14 DIAGNOSIS — F17200 Nicotine dependence, unspecified, uncomplicated: Secondary | ICD-10-CM | POA: Diagnosis present

## 2024-07-14 DIAGNOSIS — Z833 Family history of diabetes mellitus: Secondary | ICD-10-CM

## 2024-07-14 DIAGNOSIS — R112 Nausea with vomiting, unspecified: Principal | ICD-10-CM

## 2024-07-14 DIAGNOSIS — K56609 Unspecified intestinal obstruction, unspecified as to partial versus complete obstruction: Secondary | ICD-10-CM

## 2024-07-14 DIAGNOSIS — J385 Laryngeal spasm: Secondary | ICD-10-CM | POA: Diagnosis not present

## 2024-07-14 DIAGNOSIS — Z716 Tobacco abuse counseling: Secondary | ICD-10-CM

## 2024-07-14 DIAGNOSIS — K222 Esophageal obstruction: Secondary | ICD-10-CM | POA: Diagnosis present

## 2024-07-14 DIAGNOSIS — R1010 Upper abdominal pain, unspecified: Secondary | ICD-10-CM

## 2024-07-14 DIAGNOSIS — Z8249 Family history of ischemic heart disease and other diseases of the circulatory system: Secondary | ICD-10-CM

## 2024-07-14 DIAGNOSIS — Z79899 Other long term (current) drug therapy: Secondary | ICD-10-CM

## 2024-07-14 DIAGNOSIS — K8050 Calculus of bile duct without cholangitis or cholecystitis without obstruction: Secondary | ICD-10-CM

## 2024-07-14 DIAGNOSIS — R1013 Epigastric pain: Secondary | ICD-10-CM

## 2024-07-14 DIAGNOSIS — R109 Unspecified abdominal pain: Secondary | ICD-10-CM | POA: Insufficient documentation

## 2024-07-14 DIAGNOSIS — K760 Fatty (change of) liver, not elsewhere classified: Secondary | ICD-10-CM | POA: Diagnosis present

## 2024-07-14 DIAGNOSIS — K8051 Calculus of bile duct without cholangitis or cholecystitis with obstruction: Principal | ICD-10-CM | POA: Diagnosis present

## 2024-07-14 DIAGNOSIS — D72829 Elevated white blood cell count, unspecified: Secondary | ICD-10-CM | POA: Diagnosis not present

## 2024-07-14 DIAGNOSIS — K567 Ileus, unspecified: Secondary | ICD-10-CM | POA: Diagnosis not present

## 2024-07-14 DIAGNOSIS — Z880 Allergy status to penicillin: Secondary | ICD-10-CM

## 2024-07-14 DIAGNOSIS — F17210 Nicotine dependence, cigarettes, uncomplicated: Secondary | ICD-10-CM | POA: Diagnosis present

## 2024-07-14 HISTORY — DX: Fatty (change of) liver, not elsewhere classified: K76.0

## 2024-07-14 LAB — URINALYSIS, ROUTINE W REFLEX MICROSCOPIC
Bilirubin Urine: NEGATIVE
Glucose, UA: NEGATIVE mg/dL
Hgb urine dipstick: NEGATIVE
Ketones, ur: NEGATIVE mg/dL
Leukocytes,Ua: NEGATIVE
Nitrite: NEGATIVE
Protein, ur: NEGATIVE mg/dL
Specific Gravity, Urine: 1.005 (ref 1.005–1.030)
pH: 6 (ref 5.0–8.0)

## 2024-07-14 LAB — LIPASE, BLOOD: Lipase: 55 U/L — ABNORMAL HIGH (ref 11–51)

## 2024-07-14 LAB — COMPREHENSIVE METABOLIC PANEL WITH GFR
ALT: 16 U/L (ref 0–44)
AST: 20 U/L (ref 15–41)
Albumin: 3.7 g/dL (ref 3.5–5.0)
Alkaline Phosphatase: 76 U/L (ref 38–126)
Anion gap: 11 (ref 5–15)
BUN: 8 mg/dL (ref 6–20)
CO2: 23 mmol/L (ref 22–32)
Calcium: 9.3 mg/dL (ref 8.9–10.3)
Chloride: 106 mmol/L (ref 98–111)
Creatinine, Ser: 0.83 mg/dL (ref 0.61–1.24)
GFR, Estimated: >60 mL/min (ref >60)
Glucose, Bld: 118 mg/dL — ABNORMAL HIGH (ref 70–99)
Potassium: 3.4 mmol/L — ABNORMAL LOW (ref 3.5–5.1)
Sodium: 140 mmol/L (ref 135–145)
Total Bilirubin: 0.6 mg/dL (ref 0.0–1.2)
Total Protein: 7.3 g/dL (ref 6.5–8.1)

## 2024-07-14 LAB — CBC
HCT: 47.4 % (ref 39.0–52.0)
Hemoglobin: 15.9 g/dL (ref 13.0–17.0)
MCH: 32.2 pg (ref 26.0–34.0)
MCHC: 33.5 g/dL (ref 30.0–36.0)
MCV: 96 fL (ref 80.0–100.0)
Platelets: 228 K/uL (ref 150–400)
RBC: 4.94 MIL/uL (ref 4.22–5.81)
RDW: 13.6 % (ref 11.5–15.5)
WBC: 6.6 K/uL (ref 4.0–10.5)
nRBC: 0 % (ref 0.0–0.2)

## 2024-07-14 LAB — MAGNESIUM: Magnesium: 1.7 mg/dL (ref 1.7–2.4)

## 2024-07-14 MED ORDER — ONDANSETRON HCL 4 MG/2ML IJ SOLN
4.0000 mg | Freq: Once | INTRAMUSCULAR | Status: AC
  Administered 2024-07-14: 4 mg via INTRAVENOUS
  Filled 2024-07-14: qty 2

## 2024-07-14 MED ORDER — SENNOSIDES-DOCUSATE SODIUM 8.6-50 MG PO TABS: 1.0000 | ORAL_TABLET | Freq: Every evening | ORAL | Status: DC | PRN

## 2024-07-14 MED ORDER — BICTEGRAVIR-EMTRICITAB-TENOFOV 50-200-25 MG PO TABS
1.0000 | ORAL_TABLET | Freq: Every day | ORAL | Status: DC
  Administered 2024-07-15 – 2024-07-20 (×6): 1 via ORAL
  Filled 2024-07-14 (×6): qty 1

## 2024-07-14 MED ORDER — ACETAMINOPHEN 650 MG RE SUPP: 650.0000 mg | Freq: Four times a day (QID) | RECTAL | Status: AC | PRN

## 2024-07-14 MED ORDER — PANTOPRAZOLE SODIUM 40 MG PO TBEC
40.0000 mg | DELAYED_RELEASE_TABLET | Freq: Every day | ORAL | Status: DC
  Administered 2024-07-15 – 2024-07-20 (×6): 40 mg via ORAL
  Filled 2024-07-14 (×6): qty 1

## 2024-07-14 MED ORDER — GADOBUTROL 1 MMOL/ML IV SOLN
7.0000 mL | Freq: Once | INTRAVENOUS | Status: AC | PRN
  Administered 2024-07-14: 7 mL via INTRAVENOUS

## 2024-07-14 MED ORDER — NICOTINE 21 MG/24HR TD PT24: 21.0000 mg | MEDICATED_PATCH | Freq: Every day | TRANSDERMAL | Status: AC | PRN

## 2024-07-14 MED ORDER — ACETAMINOPHEN 325 MG PO TABS
650.0000 mg | ORAL_TABLET | Freq: Four times a day (QID) | ORAL | Status: AC | PRN
Start: 2024-07-14 — End: 2024-07-19
  Administered 2024-07-15 – 2024-07-19 (×2): 650 mg via ORAL
  Filled 2024-07-14 (×2): qty 2

## 2024-07-14 MED ORDER — ONDANSETRON HCL 4 MG PO TABS
4.0000 mg | ORAL_TABLET | Freq: Four times a day (QID) | ORAL | Status: AC | PRN
  Administered 2024-07-16: 4 mg via ORAL

## 2024-07-14 MED ORDER — ONDANSETRON HCL 4 MG/2ML IJ SOLN
4.0000 mg | Freq: Four times a day (QID) | INTRAMUSCULAR | Status: AC | PRN
  Administered 2024-07-17: 4 mg via INTRAVENOUS
  Filled 2024-07-14 (×2): qty 2

## 2024-07-14 MED ORDER — MORPHINE SULFATE (PF) 4 MG/ML IV SOLN
4.0000 mg | Freq: Once | INTRAVENOUS | Status: AC
  Administered 2024-07-14: 4 mg via INTRAVENOUS
  Filled 2024-07-14: qty 1

## 2024-07-14 MED ORDER — SODIUM CHLORIDE 0.9 % IV BOLUS
1000.0000 mL | Freq: Once | INTRAVENOUS | Status: AC
  Administered 2024-07-14: 1000 mL via INTRAVENOUS

## 2024-07-14 MED ORDER — MORPHINE SULFATE (PF) 2 MG/ML IV SOLN
2.0000 mg | INTRAVENOUS | Status: AC | PRN
  Administered 2024-07-14 – 2024-07-15 (×4): 2 mg via INTRAVENOUS
  Filled 2024-07-14 (×4): qty 1

## 2024-07-14 MED ORDER — IOHEXOL 300 MG/ML  SOLN
100.0000 mL | Freq: Once | INTRAMUSCULAR | Status: AC | PRN
  Administered 2024-07-14: 100 mL via INTRAVENOUS

## 2024-07-14 MED ORDER — LACTATED RINGERS IV SOLN: INTRAVENOUS | Status: AC

## 2024-07-14 MED ORDER — MORPHINE SULFATE (PF) 4 MG/ML IV SOLN: 4.0000 mg | INTRAVENOUS | Status: AC | PRN

## 2024-07-14 NOTE — Hospital Course (Addendum)
 Mr. Timothy Salas is a 53 year old male with history of hepatic steatosis, HIV, currently on triple therapy, who presents to the emergency department for chief concerns of abdominal pain, nausea, vomiting.  Vitals in the ED showed temperature 98.1, respiration rate 17, heart rate 98, blood pressure 147/95, SpO2 96% on room air.  Serum sodium is 140, potassium 3.4, chloride 106, bicarb 23, BUN of 8, serum creatinine 0.83, EGFR greater than 60, nonfasting blood glucose 118, WBC 6.6, hemoglobin 15.9, platelets of 228.  Lipase was 55. UA was negative for leukocytes and nitrates.  MRCP w and wo contrast: Was read as mild dilatation of the common bile duct at 8 mm in diameter with truncated distal margin and meniscus appearing favoring distal choledocholithiasis.  ED treatment: Morphine  4 mg IV one-time dose, ondansetron  4 mg IV one-time dose, sodium chloride  1 L bolus

## 2024-07-14 NOTE — Assessment & Plan Note (Signed)
 Patient smokes 4 to 5 cigarettes/day. Counseled for cessation

## 2024-07-14 NOTE — ED Provider Notes (Signed)
 [facility] Provider Note    Event Date/Time   First MD Initiated Contact with Patient 07/14/24 [phone]     (approximate)   History   Abdominal Pain   HPI  Timothy Salas. is a 53 y.o. male history of HIV presents emergency department with epigastric pain, vomiting, fever and 1 episode of diarrhea this morning.  States pain is in the epigastric area and to the right.  Years ago had a intestinal abscess.  Denies chest pain/shortness of breath.      Physical Exam   Triage Vital Signs: ED Triage Vitals [07/14/24 0921]  Encounter Vitals Group     BP (!) 147/95     Girls Systolic BP Percentile      Girls Diastolic BP Percentile      Boys Systolic BP Percentile      Boys Diastolic BP Percentile      Pulse Rate 96     Resp 17     Temp 98.1 F (36.7 C)     Temp Source Oral     SpO2 96 %     Weight 170 lb (77.1 kg)     Height 5' 4 (1.626 m)     Head Circumference      Peak Flow      Pain Score 6     Pain Loc      Pain Education      Exclude from Growth Chart     Most recent vital signs: Vitals:   07/14/24 0921  BP: (!) 147/95  Pulse: 96  Resp: 17  Temp: 98.1 F (36.7 C)  SpO2: 96%     General: Awake, no distress.   CV:  Good peripheral perfusion. regular rate and  rhythm Resp:  Normal effort. Lungs CTA Abd:  No distention.  Tender in the right upper quadrant and epigastric area Other:      ED Results / Procedures / Treatments   Labs (all labs ordered are listed, but only abnormal results are displayed) Labs Reviewed  LIPASE, BLOOD - Abnormal; Notable for the following components:      Result Value   Lipase 55 (*)    All other components within normal limits  COMPREHENSIVE METABOLIC PANEL WITH GFR - Abnormal; Notable for the following components:   Potassium 3.4 (*)    Glucose, Bld 118 (*)    All other components within normal limits  URINALYSIS, ROUTINE W REFLEX MICROSCOPIC - Abnormal; Notable for the following  components:   Color, Urine YELLOW (*)    APPearance CLEAR (*)    All other components within normal limits  CBC     EKG     RADIOLOGY CT abdomen pelvis IV contrast    PROCEDURES:   Procedures  Critical Care:  no Chief Complaint  Patient presents with   Abdominal Pain      MEDICATIONS ORDERED IN ED: Medications  morphine  (PF) 4 MG/ML injection 4 mg (has no administration in time range)  sodium chloride  0.9 % bolus 1,000 mL (1,000 mLs Intravenous New Bag/Given 07/14/24 1014)  ondansetron  (ZOFRAN ) injection 4 mg (4 mg Intravenous Given 07/14/24 1014)  iohexol  (OMNIPAQUE ) 300 MG/ML solution 100 mL (100 mLs Intravenous Contrast Given 07/14/24 1102)     IMPRESSION / MDM / ASSESSMENT AND PLAN / ED COURSE  I reviewed the triage vital signs and the nursing notes.  Differential diagnosis includes, but is not limited to, PUD, acute cholecystitis, pancreatitis, gastroenteritis, bowel obstruction, abscess, dehydration  Patient's presentation is most consistent with acute illness / injury with system symptoms.    Medications given: Normal saline 1 L IV, Zofran  4 mg IV  Labs and imaging ordered I did offer the patient pain medication, he would like to hold off at this time  Labs are reassuring with very minimal elevation of the lipase at 55   CT abdomen pelvis independently reviewed interpreted by me as being positive for possible debris in the duct.  Consult to GI, Dr. Jinny recommends MRCP.  I did explain all this to the patient.  Will sign out to Dr. Levander at this time.  Care is transferred to Dr. Brien.   FINAL CLINICAL IMPRESSION(S) / ED DIAGNOSES   Final diagnoses:  Nausea vomiting and diarrhea  Pain of upper abdomen     Rx / DC Orders   ED Discharge Orders     None        Note:  This document was prepared using Dragon voice recognition software and may include unintentional dictation errors.    Gasper Devere ORN,  PA-C 07/14/24 1328    Levander Slate, MD 07/14/24 [phone]

## 2024-07-14 NOTE — Consult Note (Signed)
 Timothy Copping, MD [facility]  [address] [place], [place] [address] Phone: [phone] Fax : [phone]  Consultation  Referring Provider:     Dr. Levander Primary Care Physician:  Osa Geralds, NP Primary Gastroenterologist: Sampson         Reason for Consultation:     Abdominal pain  Date of Admission:  07/14/2024 Date of Consultation:  07/14/2024           HPI:   Timothy Salas. is a 53 y.o. male who has a history of HIV who presented to the emergency department with epigastric pain and vomiting with fever and diarrhea.  He reports that the abdominal pain is located in the epigastric area and in the right upper quadrant.  The patient had a CT scan of the abdomen that showed:  IMPRESSION: 1. Mild dilation of the extrahepatic bile duct measuring up to 10 mm in diameter with a subtle filling defect within the distal duct at the level of the ampulla, suspicious for intraductal debris or calculi. Correlate with liver enzymes and consider further evaluation with MRCP. 2. Mild hepatic steatosis. 3. Small hiatal hernia.   Due to these findings the patient underwent an MRCP that showed:  IMPRESSION: 1. Mild dilatation of the common bile duct at 0.8 cm in diameter with a truncated distal margin and meniscus appearance favoring distal choledocholithiasis. No significant wall enhancement in the distal CBD. No abnormal enhancing lesion in the pancreatic head along the distal common bile duct. 2. Mild hepatic steatosis. 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 5.  Aortic Atherosclerosis  The patient's lipase was slightly elevated at 55 with a normal CBC and liver enzymes that were also normal.  The patient reports that his abdominal pain started yesterday and he had some nausea and vomiting with it.  He denies any vomiting up of any blood.  The patient denies taking any anti-inflammatory medications or any blood thinners.   Past  Medical History:  Diagnosis Date   HIV (human immunodeficiency virus infection) (HCC)     History reviewed. No pertinent surgical history.  Prior to Admission medications   Medication Sig Start Date End Date Taking? Authorizing Provider  cyclobenzaprine  (FLEXERIL ) 10 MG tablet Take 1 tablet (10 mg total) by mouth 3 (three) times daily as needed. 04/22/24   Jacolyn Pae, MD  elvitegravir-cobicistat-emtricitabine -tenofovir  (STRIBILD) 150-150-200-300 MG TABS tablet Take 1 tablet by mouth daily with breakfast. 08/25/14   Comer, Lamar ORN, MD  ibuprofen  (ADVIL ) 600 MG tablet Take 1 tablet (600 mg total) by mouth every 6 (six) hours as needed. 04/22/24   Jacolyn Pae, MD    History reviewed. No pertinent family history.   Social History   Tobacco Use   Smoking status: Every Day    Current packs/day: 0.50    Types: Cigarettes   Smokeless tobacco: Never   Tobacco comments:    e-cigarettes  Vaping Use   Vaping status: Never Used  Substance Use Topics   Alcohol use: Yes    Comment: occassionally   Drug use: Yes    Types: Marijuana    Comment: daily use    Allergies as of 07/14/2024 - Review Complete 07/14/2024  Allergen Reaction Noted   Amoxicillin  02/09/2010    Review of Systems:    All systems reviewed and negative except where noted in HPI.   Physical Exam:  Vital signs in last 24 hours: Temp:  [98.1 F (36.7 C)] 98.1 F (  36.7 C) (07/22 1344) Pulse Rate:  [94-96] 94 (07/22 1344) Resp:  [17] 17 (07/22 1344) BP: (138-147)/(88-95) 138/88 (07/22 1344) SpO2:  [96 %-98 %] 98 % (07/22 1344) Weight:  [77.1 kg] 77.1 kg (07/22 0921)   General:   Pleasant, cooperative in NAD Head:  Normocephalic and atraumatic. Eyes:   No icterus.   Conjunctiva pink. PERRLA. Ears:  Normal auditory acuity. Neck:  Supple; no masses or thyroidomegaly Lungs: Respirations even and unlabored. Lungs clear to auscultation bilaterally.   No wheezes, crackles, or rhonchi.  Heart:  Regular  rate and rhythm;  Without murmur, clicks, rubs or gallops Abdomen:  Soft, nondistended, nontender. Normal bowel sounds. No appreciable masses or hepatomegaly.  No rebound or guarding.  Rectal:  Not performed. Msk:  Symmetrical without gross deformities.    Extremities:  Without edema, cyanosis or clubbing. Neurologic:  Alert and oriented x3;  grossly normal neurologically. Skin:  Intact without significant lesions or rashes. Cervical Nodes:  No significant cervical adenopathy. Psych:  Alert and cooperative. Normal affect.  LAB RESULTS: Recent Labs    07/14/24 0923  WBC 6.6  HGB 15.9  HCT 47.4  PLT 228   BMET Recent Labs    07/14/24 0923  NA 140  K 3.4*  CL 106  CO2 23  GLUCOSE 118*  BUN 8  CREATININE 0.83  CALCIUM 9.3   LFT Recent Labs    07/14/24 0923  PROT 7.3  ALBUMIN 3.7  AST 20  ALT 16  ALKPHOS 76  BILITOT 0.6   PT/INR No results for input(s): LABPROT, INR in the last 72 hours.  STUDIES: MR ABDOMEN MRCP W WO CONTAST Result Date: 07/14/2024 CLINICAL DATA:  Right upper quadrant abdominal pain nausea and vomiting EXAM: MRI ABDOMEN WITHOUT AND WITH CONTRAST (INCLUDING MRCP) TECHNIQUE: Multiplanar multisequence MR imaging of the abdomen was performed both before and after the administration of intravenous contrast. Heavily T2-weighted images of the biliary and pancreatic ducts were obtained, and three-dimensional MRCP images were rendered by post processing. CONTRAST:  7mL GADAVIST  GADOBUTROL  1 MMOL/ML IV SOLN COMPARISON:  CT scan 07/14/2024 FINDINGS: Lower chest: Unremarkable Hepatobiliary: Mild hepatic steatosis. No gallbladder wall thickening. Common bile duct mildly dilated at 0.8 cm in diameter with a truncated distal margin and meniscus appearance as on image 74 series 14 favoring distal choledocholithiasis. No significant wall enhancement in the distal CBD. No abnormal enhancing lesion in the pancreatic head along the distal common bile duct. No significant  intrahepatic biliary dilatation. The gallbladder appears unremarkable.  No pericholecystic fluid. Pancreas:  Partial pancreas divisum. Spleen:  Unremarkable Adrenals/Urinary Tract:  Unremarkable Stomach/Bowel: Transverse duodenal diverticulum. Vascular/Lymphatic:  Abdominal aortic atherosclerosis. Other:  No supplemental non-categorized findings. Musculoskeletal: Unremarkable IMPRESSION: 1. Mild dilatation of the common bile duct at 0.8 cm in diameter with a truncated distal margin and meniscus appearance favoring distal choledocholithiasis. No significant wall enhancement in the distal CBD. No abnormal enhancing lesion in the pancreatic head along the distal common bile duct. 2. Mild hepatic steatosis. 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 5.  Aortic Atherosclerosis (ICD10-I70.0). Electronically Signed   By: Ryan Salvage M.D.   On: 07/14/2024 14:32   MR 3D Recon At Scanner Result Date: 07/14/2024 CLINICAL DATA:  Right upper quadrant abdominal pain nausea and vomiting EXAM: MRI ABDOMEN WITHOUT AND WITH CONTRAST (INCLUDING MRCP) TECHNIQUE: Multiplanar multisequence MR imaging of the abdomen was performed both before and after the administration of intravenous contrast. Heavily T2-weighted images of the biliary and pancreatic ducts  were obtained, and three-dimensional MRCP images were rendered by post processing. CONTRAST:  7mL GADAVIST  GADOBUTROL  1 MMOL/ML IV SOLN COMPARISON:  CT scan 07/14/2024 FINDINGS: Lower chest: Unremarkable Hepatobiliary: Mild hepatic steatosis. No gallbladder wall thickening. Common bile duct mildly dilated at 0.8 cm in diameter with a truncated distal margin and meniscus appearance as on image 74 series 14 favoring distal choledocholithiasis. No significant wall enhancement in the distal CBD. No abnormal enhancing lesion in the pancreatic head along the distal common bile duct. No significant intrahepatic biliary dilatation. The gallbladder appears  unremarkable.  No pericholecystic fluid. Pancreas:  Partial pancreas divisum. Spleen:  Unremarkable Adrenals/Urinary Tract:  Unremarkable Stomach/Bowel: Transverse duodenal diverticulum. Vascular/Lymphatic:  Abdominal aortic atherosclerosis. Other:  No supplemental non-categorized findings. Musculoskeletal: Unremarkable IMPRESSION: 1. Mild dilatation of the common bile duct at 0.8 cm in diameter with a truncated distal margin and meniscus appearance favoring distal choledocholithiasis. No significant wall enhancement in the distal CBD. No abnormal enhancing lesion in the pancreatic head along the distal common bile duct. 2. Mild hepatic steatosis. 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 5.  Aortic Atherosclerosis (ICD10-I70.0). Electronically Signed   By: Ryan Salvage M.D.   On: 07/14/2024 14:32   CT ABDOMEN PELVIS W CONTRAST Result Date: 07/14/2024 CLINICAL DATA:  Acute nonlocalized abdominal pain EXAM: CT ABDOMEN AND PELVIS WITH CONTRAST TECHNIQUE: Multidetector CT imaging of the abdomen and pelvis was performed using the standard protocol following bolus administration of intravenous contrast. RADIATION DOSE REDUCTION: This exam was performed according to the departmental dose-optimization program which includes automated exposure control, adjustment of the mA and/or kV according to patient size and/or use of iterative reconstruction technique. CONTRAST:  OMNIPAQUE  IOHEXOL  300 MG/ML  SOLN COMPARISON:  09/16/2017 FINDINGS: Lower chest: No acute abnormality.  Small hiatal hernia Hepatobiliary: Mild hepatic steatosis. No enhancing intrahepatic mass. The extrahepatic bile duct is mildly dilated measuring up to 10 mm in diameter and there is a a subtle filling defect within the distal duct at the level of the into a, best seen on image # 38/2 and 44/5, suspicious for intraductal debris or calculi. No intrahepatic biliary ductal dilation. Gallbladder unremarkable. Pancreas:  Unremarkable Spleen: Unremarkable Adrenals/Urinary Tract: Adrenal glands are unremarkable. Kidneys are normal, without renal calculi, focal lesion, or hydronephrosis. Bladder is unremarkable. Stomach/Bowel: Stomach is within normal limits. Appendix appears normal. No evidence of bowel wall thickening, distention, or inflammatory changes. No free intraperitoneal gas or fluid Vascular/Lymphatic: Aortic atherosclerosis. No enlarged abdominal or pelvic lymph nodes. Reproductive: Prostate is unremarkable. Other: None significant Musculoskeletal: No acute or significant osseous findings. IMPRESSION: 1. Mild dilation of the extrahepatic bile duct measuring up to 10 mm in diameter with a subtle filling defect within the distal duct at the level of the ampulla, suspicious for intraductal debris or calculi. Correlate with liver enzymes and consider further evaluation with MRCP. 2. Mild hepatic steatosis. 3. Small hiatal hernia. Electronically Signed   By: Dorethia Molt M.D.   On: 07/14/2024 11:57      Impression / Plan:   Assessment: Active Problems:   * No active hospital problems. *   Timothy Salas. is a 53 y.o. y/o male with with a history of HIV who is not on any blood thinners who came in with epigastric pain with nausea vomiting no report of fevers.  The patient was found on MRCP to have a common bile duct stone.  The patient's liver enzymes are normal.  The patient was given the opportunity to  set up the ERCP as an outpatient but would rather be admitted and have the procedure done while he is here.  Plan:  The patient will be set up for any ERCP for tomorrow.  The patient has been told that the ERCP has risks of procedure failure especially since he was found to have a diverticulum in the duodenum.  He has also been told the risk of pancreatitis and bleeding and are not limited to that but also can result in death from pancreatitis.  The patient has been given a chance to ask questions and  all questions were answered.  The patient has agreed to be set up for the ERCP for tomorrow.  Thank you for involving me in the care of this patient.      LOS: 0 days   Timothy Copping, MD, MD. NOLIA 07/14/2024, 2:49 PM,  Pager [phone] 7am-5pm  Check AMION for 5pm -7am coverage and on weekends   Note: This dictation was prepared with Dragon dictation along with smaller phrase technology. Any transcriptional errors that result from this process are unintentional.

## 2024-07-14 NOTE — H&P (Signed)
 History and Physical   Elsie Carlin Alexa Mickey. FMW:[id number] DOB: [birth year] DOA: 07/14/2024  PCP: Osa Geralds, NP  Patient coming from: home   I have personally briefly reviewed patient's old medical records in [facility] EMR.  Chief Concern: abdominal pain  HPI: Mr. [place] Head is a 53 year old male with history of hepatic steatosis, HIV, currently on triple therapy, who presents to the emergency department for chief concerns of abdominal pain, nausea, vomiting.  Vitals in the ED showed temperature 98.1, respiration rate 17, heart rate 98, blood pressure 147/95, SpO2 96% on room air.  Serum sodium is 140, potassium 3.4, chloride 106, bicarb 23, BUN of 8, serum creatinine 0.83, EGFR greater than 60, nonfasting blood glucose 118, WBC 6.6, hemoglobin 15.9, platelets of 228.  Lipase was 55. UA was negative for leukocytes and nitrates.  MRCP w and wo contrast: Was read as mild dilatation of the common bile duct at 8 mm in diameter with truncated distal margin and meniscus appearing favoring distal choledocholithiasis.  ED treatment: Morphine  4 mg IV one-time dose, ondansetron  4 mg IV one-time dose, sodium chloride  1 L bolus ---------------------------------- At bedside, patient able to tell me his first and last name, age, location, current calendar year.  He reports he has nausea and vomiting yesterday.  The pain started this morning at around 3:30 AM.  He denies trauma to his person. He reports he is never felt this way before.  He reports his husband took his temperature this morning and states it was either 100.1 or 101 this.  Patient is unsure.  Patient took a Tylenol  before coming to the ED.  He denies chest pain, dysuria, hematuria, diarrhea.  Social history: He lives at home with his husband.  He denies tobacco, EtOH, recreational drug use.  ROS: Constitutional: no weight change, no fever ENT/Mouth: no sore throat, no rhinorrhea Eyes: no eye pain, no vision  changes Cardiovascular: no chest pain, no dyspnea,  no edema, no palpitations Respiratory: no cough, no sputum, no wheezing Gastrointestinal: + nausea, no vomiting, no diarrhea, no constipation, + RUQ abdominal pain Genitourinary: no urinary incontinence, no dysuria, no hematuria Musculoskeletal: no arthralgias, no myalgias Skin: no skin lesions, no pruritus, Neuro: no weakness, no loss of consciousness, no syncope Psych: no anxiety, no depression, + decrease appetite Heme/Lymph: no bruising, no bleeding  ED Course: Discussed with EDP, patient required hospitalization for chief concerns of common bile duct stone.  Assessment/Plan  Principal Problem:   Common bile duct (CBD) obstruction Active Problems:   HIV DISEASE   TOBACCO USER   Abdominal pain   Assessment and Plan:  * Common bile duct (CBD) obstruction Gastroenterology has been consulted Symptomatic support Recheck BMP in a.m.  Abdominal pain With nausea and vomiting Suspect secondary to CBD stone Symptomatic support: Morphine  2 mg IV every 4 hours as needed for moderate pain, 20 hours ordered; morphine  4 mg IV every 4 hours as needed for severe pain, 20 hours ordered LR infusion at 100 mL/h, 1 day ordered  TOBACCO USER Patient smokes 4 to 5 cigarettes/day. Counseled for cessation  HIV DISEASE Home Biktarvy  resumed, 47/23/25.  Patient took his Biktarvy  dose in a.m. at home already prior to presenting to the ED  Chart reviewed.   DVT prophylaxis: Pharmacologic DVT not initiated on admission.  AM team to initiate pharmacologic DVT when the benefits outweigh the risk. Code Status: Full code Diet: Regular diet; n.p.o. after midnight Family Communication: Phone call was offered, patient declined stating that his husband  knows he is being admitted to the hospital Disposition Plan: Pending clinical course Consults called: Gastroenterology Admission status: Telemetry medical, inpatient  Past Medical History:   Diagnosis Date   Hepatic steatosis    HIV (human immunodeficiency virus infection) (HCC)    History reviewed. No pertinent surgical history.  Social History:  reports that he has been smoking cigarettes. He has never used smokeless tobacco. He reports current alcohol use. He reports current drug use. Drug: Marijuana.  Allergies  Allergen Reactions   Amoxicillin Dermatitis, Hives, Itching and Nausea Only    REACTION: hives   Family History  Problem Relation Age of Onset   Heart disease Father    Diabetes Maternal Grandmother    Diabetes Paternal Grandmother    Heart disease Paternal Grandfather    Family history: Family history reviewed and not pertinent  Prior to Admission medications   Medication Sig Start Date End Date Taking? Authorizing Provider  cyclobenzaprine  (FLEXERIL ) 10 MG tablet Take 1 tablet (10 mg total) by mouth 3 (three) times daily as needed. 04/22/24   Jacolyn Pae, MD  elvitegravir-cobicistat-emtricitabine -tenofovir  (STRIBILD) 150-150-200-300 MG TABS tablet Take 1 tablet by mouth daily with breakfast. 08/25/14   Comer, Lamar ORN, MD  ibuprofen  (ADVIL ) 600 MG tablet Take 1 tablet (600 mg total) by mouth every 6 (six) hours as needed. 04/22/24   Jacolyn Pae, MD   Physical Exam: Vitals:   07/14/24 1344 07/14/24 1700 07/14/24 1730 07/14/24 1746  BP: 138/88 (!) 154/90 (!) 144/94   Pulse: 94 75 72   Resp: 17 15 (!) 22   Temp: 98.1 F (36.7 C)   98.1 F (36.7 C)  TempSrc:    Oral  SpO2: 98% 93% 94%   Weight:      Height:       Constitutional: appears age-appropriate, NAD, calm Eyes: PERRL, lids and conjunctivae normal ENMT: Mucous membranes are moist. Posterior pharynx clear of any exudate or lesions. Age-appropriate dentition. Hearing appropriate Neck: normal, supple, no masses, no thyromegaly Respiratory: clear to auscultation bilaterally, no wheezing, no crackles. Normal respiratory effort. No accessory muscle use.  Cardiovascular: Regular rate  and rhythm, no murmurs / rubs / gallops. No extremity edema. 2+ pedal pulses. No carotid bruits.  Abdomen: + RUQ tenderness, no masses palpated, no hepatosplenomegaly. Bowel sounds positive.  Musculoskeletal: no clubbing / cyanosis. No joint deformity upper and lower extremities. Good ROM, no contractures, no atrophy. Normal muscle tone.  Skin: no rashes, lesions, ulcers. No induration Neurologic: Sensation intact. Strength 5/5 in all 4.  Psychiatric: Normal judgment and insight. Alert and oriented x 3. Normal mood.   EKG: Not indicated at this time  Chest x-ray on Admission: Not indicated at this time  MR ABDOMEN MRCP W WO CONTAST Result Date: 07/14/2024 CLINICAL DATA:  Right upper quadrant abdominal pain nausea and vomiting EXAM: MRI ABDOMEN WITHOUT AND WITH CONTRAST (INCLUDING MRCP) TECHNIQUE: Multiplanar multisequence MR imaging of the abdomen was performed both before and after the administration of intravenous contrast. Heavily T2-weighted images of the biliary and pancreatic ducts were obtained, and three-dimensional MRCP images were rendered by post processing. CONTRAST:  7mL GADAVIST  GADOBUTROL  1 MMOL/ML IV SOLN COMPARISON:  CT scan 07/14/2024 FINDINGS: Lower chest: Unremarkable Hepatobiliary: Mild hepatic steatosis. No gallbladder wall thickening. Common bile duct mildly dilated at 0.8 cm in diameter with a truncated distal margin and meniscus appearance as on image 74 series 14 favoring distal choledocholithiasis. No significant wall enhancement in the distal CBD. No abnormal enhancing lesion in the pancreatic  head along the distal common bile duct. No significant intrahepatic biliary dilatation. The gallbladder appears unremarkable.  No pericholecystic fluid. Pancreas:  Partial pancreas divisum. Spleen:  Unremarkable Adrenals/Urinary Tract:  Unremarkable Stomach/Bowel: Transverse duodenal diverticulum. Vascular/Lymphatic:  Abdominal aortic atherosclerosis. Other:  No supplemental  non-categorized findings. Musculoskeletal: Unremarkable IMPRESSION: 1. Mild dilatation of the common bile duct at 0.8 cm in diameter with a truncated distal margin and meniscus appearance favoring distal choledocholithiasis. No significant wall enhancement in the distal CBD. No abnormal enhancing lesion in the pancreatic head along the distal common bile duct. 2. Mild hepatic steatosis. 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 5.  Aortic Atherosclerosis (ICD10-I70.0). Electronically Signed   By: Ryan Salvage M.D.   On: 07/14/2024 14:32   MR 3D Recon At Scanner Result Date: 07/14/2024 CLINICAL DATA:  Right upper quadrant abdominal pain nausea and vomiting EXAM: MRI ABDOMEN WITHOUT AND WITH CONTRAST (INCLUDING MRCP) TECHNIQUE: Multiplanar multisequence MR imaging of the abdomen was performed both before and after the administration of intravenous contrast. Heavily T2-weighted images of the biliary and pancreatic ducts were obtained, and three-dimensional MRCP images were rendered by post processing. CONTRAST:  7mL GADAVIST  GADOBUTROL  1 MMOL/ML IV SOLN COMPARISON:  CT scan 07/14/2024 FINDINGS: Lower chest: Unremarkable Hepatobiliary: Mild hepatic steatosis. No gallbladder wall thickening. Common bile duct mildly dilated at 0.8 cm in diameter with a truncated distal margin and meniscus appearance as on image 74 series 14 favoring distal choledocholithiasis. No significant wall enhancement in the distal CBD. No abnormal enhancing lesion in the pancreatic head along the distal common bile duct. No significant intrahepatic biliary dilatation. The gallbladder appears unremarkable.  No pericholecystic fluid. Pancreas:  Partial pancreas divisum. Spleen:  Unremarkable Adrenals/Urinary Tract:  Unremarkable Stomach/Bowel: Transverse duodenal diverticulum. Vascular/Lymphatic:  Abdominal aortic atherosclerosis. Other:  No supplemental non-categorized findings. Musculoskeletal: Unremarkable  IMPRESSION: 1. Mild dilatation of the common bile duct at 0.8 cm in diameter with a truncated distal margin and meniscus appearance favoring distal choledocholithiasis. No significant wall enhancement in the distal CBD. No abnormal enhancing lesion in the pancreatic head along the distal common bile duct. 2. Mild hepatic steatosis. 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 5.  Aortic Atherosclerosis (ICD10-I70.0). Electronically Signed   By: Ryan Salvage M.D.   On: 07/14/2024 14:32   CT ABDOMEN PELVIS W CONTRAST Result Date: 07/14/2024 CLINICAL DATA:  Acute nonlocalized abdominal pain EXAM: CT ABDOMEN AND PELVIS WITH CONTRAST TECHNIQUE: Multidetector CT imaging of the abdomen and pelvis was performed using the standard protocol following bolus administration of intravenous contrast. RADIATION DOSE REDUCTION: This exam was performed according to the departmental dose-optimization program which includes automated exposure control, adjustment of the mA and/or kV according to patient size and/or use of iterative reconstruction technique. CONTRAST:  OMNIPAQUE  IOHEXOL  300 MG/ML  SOLN COMPARISON:  09/16/2017 FINDINGS: Lower chest: No acute abnormality.  Small hiatal hernia Hepatobiliary: Mild hepatic steatosis. No enhancing intrahepatic mass. The extrahepatic bile duct is mildly dilated measuring up to 10 mm in diameter and there is a a subtle filling defect within the distal duct at the level of the into a, best seen on image # 38/2 and 44/5, suspicious for intraductal debris or calculi. No intrahepatic biliary ductal dilation. Gallbladder unremarkable. Pancreas: Unremarkable Spleen: Unremarkable Adrenals/Urinary Tract: Adrenal glands are unremarkable. Kidneys are normal, without renal calculi, focal lesion, or hydronephrosis. Bladder is unremarkable. Stomach/Bowel: Stomach is within normal limits. Appendix appears normal. No evidence of bowel wall thickening, distention, or  inflammatory changes. No  free intraperitoneal gas or fluid Vascular/Lymphatic: Aortic atherosclerosis. No enlarged abdominal or pelvic lymph nodes. Reproductive: Prostate is unremarkable. Other: None significant Musculoskeletal: No acute or significant osseous findings. IMPRESSION: 1. Mild dilation of the extrahepatic bile duct measuring up to 10 mm in diameter with a subtle filling defect within the distal duct at the level of the ampulla, suspicious for intraductal debris or calculi. Correlate with liver enzymes and consider further evaluation with MRCP. 2. Mild hepatic steatosis. 3. Small hiatal hernia. Electronically Signed   By: Dorethia Molt M.D.   On: 07/14/2024 11:57   Labs on Admission: I have personally reviewed following labs  CBC: Recent Labs  Lab 07/14/24 0923  WBC 6.6  HGB 15.9  HCT 47.4  MCV 96.0  PLT 228   Basic Metabolic Panel: Recent Labs  Lab 07/14/24 0923 07/14/24 1621  NA 140  --   K 3.4*  --   CL 106  --   CO2 23  --   GLUCOSE 118*  --   BUN 8  --   CREATININE 0.83  --   CALCIUM 9.3  --   MG  --  1.7   GFR: Estimated Creatinine Clearance: 95.6 mL/min (by C-G formula based on SCr of 0.83 mg/dL).  Liver Function Tests: Recent Labs  Lab 07/14/24 0923  AST 20  ALT 16  ALKPHOS 76  BILITOT 0.6  PROT 7.3  ALBUMIN 3.7   Recent Labs  Lab 07/14/24 0923  LIPASE 55*   Urine analysis:    Component Value Date/Time   COLORURINE YELLOW (A) 07/14/2024 1010   APPEARANCEUR CLEAR (A) 07/14/2024 1010   LABSPEC 1.005 07/14/2024 1010   PHURINE 6.0 07/14/2024 1010   GLUCOSEU NEGATIVE 07/14/2024 1010   HGBUR NEGATIVE 07/14/2024 1010   BILIRUBINUR NEGATIVE 07/14/2024 1010   KETONESUR NEGATIVE 07/14/2024 1010   PROTEINUR NEGATIVE 07/14/2024 1010   UROBILINOGEN 2.0 (H) 12/28/2008 2149   NITRITE NEGATIVE 07/14/2024 1010   LEUKOCYTESUR NEGATIVE 07/14/2024 1010   This document was prepared using Dragon Voice Recognition software and may include unintentional  dictation errors.  Dr. Sherre [facility]  If 7PM-7AM, please contact overnight-coverage provider If 7AM-7PM, please contact day attending provider [email or web address]  07/14/2024, 5:50 PM

## 2024-07-14 NOTE — Assessment & Plan Note (Signed)
 Gastroenterology has been consulted Symptomatic support Recheck BMP in a.m.

## 2024-07-14 NOTE — ED Provider Notes (Signed)
 Care of this patient assumed from APC at 1330 pending MRCP and disposition. Please see prior physician note for further details.  Briefly, this is a 53 year old male who presented with generalized abdominal pain with associated nausea and diarrhea starting earlier today.  Tender most notably in the right upper quadrant and epigastric area.  CT demonstrated dilation of the extrahepatic bile duct with a subtle filling defect concerning for debris versus calculi.  Case was reviewed with Dr. Jinny by ALYNE who did recommend proceeding with MRCP, signed out to me pending results of this.   1439 MRCP demonstrated dilation of the CBD with a truncated distal margin concerning for distal choledocholithiasis without associated significant wall enhancement. Will review with GI.   1508 Case reviewed with Dr. Jinny.  He has not yet been able to evaluate patient at bedside, but does recommend ERCP for the patient.  He does note that outpatient management could be considered as the patient is without evidence of sepsis or abnormal LFTs versus admission for inpatient management.  I did discuss this option with the patient, does prefer inpatient management. Dr. Jinny updated. Will reach out to hospitalist team.   1549 Case discussed with Dr. Sherre.  She will evaluate patient for anticipated admission.   Levander Slate, MD 07/14/24 [phone]

## 2024-07-14 NOTE — Assessment & Plan Note (Signed)
 With nausea and vomiting Suspect secondary to CBD stone Symptomatic support: Morphine  2 mg IV every 4 hours as needed for moderate pain, 20 hours ordered; morphine  4 mg IV every 4 hours as needed for severe pain, 20 hours ordered LR infusion at 100 mL/h, 1 day ordered

## 2024-07-14 NOTE — ED Notes (Signed)
 Called CCMD and added pt to board.

## 2024-07-14 NOTE — ED Triage Notes (Signed)
 Patient to ED via POV for generalized abd pain with nausea/ diarrhea. Pain started at 0330.

## 2024-07-14 NOTE — Assessment & Plan Note (Addendum)
 Home Biktarvy  resumed, 47/23/25.  Patient took his Biktarvy  dose in a.m. at home already prior to presenting to the ED

## 2024-07-15 ENCOUNTER — Encounter: Payer: Self-pay | Admitting: Internal Medicine

## 2024-07-15 ENCOUNTER — Inpatient Hospital Stay: Payer: Self-pay | Admitting: Certified Registered"

## 2024-07-15 ENCOUNTER — Encounter
Admission: EM | Disposition: A | Discharge status: Home / Self Care | Payer: Self-pay | Source: Home / Self Care | Attending: Internal Medicine

## 2024-07-15 ENCOUNTER — Inpatient Hospital Stay: Payer: Self-pay

## 2024-07-15 DIAGNOSIS — K5710 Diverticulosis of small intestine without perforation or abscess without bleeding: Secondary | ICD-10-CM

## 2024-07-15 DIAGNOSIS — K222 Esophageal obstruction: Secondary | ICD-10-CM

## 2024-07-15 HISTORY — PX: ERCP: SHX5425

## 2024-07-15 HISTORY — PX: ESOPHAGOGASTRODUODENOSCOPY: SHX5428

## 2024-07-15 LAB — CBC
HCT: 42 % (ref 39.0–52.0)
Hemoglobin: 14.2 g/dL (ref 13.0–17.0)
MCH: 32.9 pg (ref 26.0–34.0)
MCHC: 33.8 g/dL (ref 30.0–36.0)
MCV: 97.4 fL (ref 80.0–100.0)
Platelets: 198 K/uL (ref 150–400)
RBC: 4.31 MIL/uL (ref 4.22–5.81)
RDW: 13.6 % (ref 11.5–15.5)
WBC: 5.3 K/uL (ref 4.0–10.5)
nRBC: 0 % (ref 0.0–0.2)

## 2024-07-15 LAB — BASIC METABOLIC PANEL WITH GFR
Anion gap: 5 (ref 5–15)
BUN: 6 mg/dL (ref 6–20)
CO2: 25 mmol/L (ref 22–32)
Calcium: 8.5 mg/dL — ABNORMAL LOW (ref 8.9–10.3)
Chloride: 107 mmol/L (ref 98–111)
Creatinine, Ser: 0.76 mg/dL (ref 0.61–1.24)
GFR, Estimated: >60 mL/min (ref >60)
Glucose, Bld: 106 mg/dL — ABNORMAL HIGH (ref 70–99)
Potassium: 3.6 mmol/L (ref 3.5–5.1)
Sodium: 137 mmol/L (ref 135–145)

## 2024-07-15 SURGERY — ERCP, WITH INTERVENTION IF INDICATED
Anesthesia: General

## 2024-07-15 MED ORDER — DEXMEDETOMIDINE HCL IN NACL 80 MCG/20ML IV SOLN
INTRAVENOUS | Status: DC | PRN
  Administered 2024-07-15: 12 ug via INTRAVENOUS
  Administered 2024-07-15: 8 ug via INTRAVENOUS

## 2024-07-15 MED ORDER — LACTATED RINGERS IV SOLN: INTRAVENOUS | Status: DC

## 2024-07-15 MED ORDER — ONDANSETRON HCL 4 MG/2ML IJ SOLN
INTRAMUSCULAR | Status: DC | PRN
  Administered 2024-07-15: 4 mg via INTRAVENOUS

## 2024-07-15 MED ORDER — DEXAMETHASONE SODIUM PHOSPHATE 10 MG/ML IJ SOLN
INTRAMUSCULAR | Status: DC | PRN
  Administered 2024-07-15: 10 mg via INTRAVENOUS

## 2024-07-15 MED ORDER — LIDOCAINE HCL (CARDIAC) PF 100 MG/5ML IV SOSY
PREFILLED_SYRINGE | INTRAVENOUS | Status: DC | PRN
  Administered 2024-07-15: 100 mg via INTRAVENOUS

## 2024-07-15 MED ORDER — MIDAZOLAM HCL 2 MG/2ML IJ SOLN
INTRAMUSCULAR | Status: AC
Start: 2024-07-15 — End: 2024-07-15
  Filled 2024-07-15: qty 2

## 2024-07-15 MED ORDER — PROPOFOL 10 MG/ML IV BOLUS
INTRAVENOUS | Status: DC | PRN
  Administered 2024-07-15: 50 mg via INTRAVENOUS
  Administered 2024-07-15: 120 mg via INTRAVENOUS

## 2024-07-15 MED ORDER — MIDAZOLAM HCL 2 MG/2ML IJ SOLN
INTRAMUSCULAR | Status: DC | PRN
  Administered 2024-07-15: 2 mg via INTRAVENOUS

## 2024-07-15 MED ORDER — OXYCODONE-ACETAMINOPHEN 5-325 MG PO TABS
1.0000 | ORAL_TABLET | Freq: Four times a day (QID) | ORAL | Status: DC | PRN
  Administered 2024-07-15 – 2024-07-16 (×3): 1 via ORAL
  Filled 2024-07-15 (×4): qty 1

## 2024-07-15 MED ORDER — DICLOFENAC SUPPOSITORY 100 MG
100.0000 mg | Freq: Once | RECTAL | Status: AC
  Administered 2024-07-15: 100 mg via RECTAL

## 2024-07-15 MED ORDER — SUCCINYLCHOLINE CHLORIDE 200 MG/10ML IV SOSY
PREFILLED_SYRINGE | INTRAVENOUS | Status: DC | PRN
  Administered 2024-07-15: 100 mg via INTRAVENOUS

## 2024-07-15 MED ORDER — FENTANYL CITRATE (PF) 100 MCG/2ML IJ SOLN
INTRAMUSCULAR | Status: AC
  Filled 2024-07-15: qty 2

## 2024-07-15 MED ORDER — MORPHINE SULFATE (PF) 2 MG/ML IV SOLN
1.0000 mg | INTRAVENOUS | Status: DC | PRN
  Administered 2024-07-15 – 2024-07-16 (×2): 1 mg via INTRAVENOUS
  Filled 2024-07-15 (×2): qty 1

## 2024-07-15 MED ORDER — DICLOFENAC SUPPOSITORY 100 MG
RECTAL | Status: AC
  Filled 2024-07-15: qty 1

## 2024-07-15 MED ORDER — FENTANYL CITRATE (PF) 100 MCG/2ML IJ SOLN
INTRAMUSCULAR | Status: DC | PRN
  Administered 2024-07-15: 100 ug via INTRAVENOUS

## 2024-07-15 NOTE — Anesthesia Postprocedure Evaluation (Signed)
 Anesthesia Post Note  Patient: Timothy Salas.  Procedure(s) Performed: ERCP, WITH INTERVENTION IF INDICATED Balloon dilation wire-guided EGD (ESOPHAGOGASTRODUODENOSCOPY)  Patient location during evaluation: Endoscopy Anesthesia Type: General Level of consciousness: awake and alert Pain management: pain level controlled Vital Signs Assessment: post-procedure vital signs reviewed and stable Respiratory status: spontaneous breathing, nonlabored ventilation and respiratory function stable Cardiovascular status: blood pressure returned to baseline and stable Postop Assessment: no apparent nausea or vomiting Anesthetic complications: no   No notable events documented.   Last Vitals:  Vitals:   07/15/24 1610 07/15/24 1620  BP: 118/69 113/80  Pulse: 75 70  Resp: 16 20  Temp:    SpO2: 90% 90%    Last Pain:  Vitals:   07/15/24 1620  TempSrc:   PainSc: 0-No pain                 Fairy POUR [name]

## 2024-07-15 NOTE — Discharge Instructions (Signed)

## 2024-07-15 NOTE — Plan of Care (Signed)

## 2024-07-15 NOTE — Progress Notes (Signed)
 PROGRESS NOTE    Timothy Salas.  FMW:[id number] DOB: [birth year] DOA: 07/14/2024 PCP: Osa Geralds, NP   Assessment & Plan:   Principal Problem:   Common bile duct (CBD) obstruction Active Problems:   HIV DISEASE   TOBACCO USER   Abdominal pain  Assessment and Plan:  Common bile duct obstruction: s/p ERCP in which biliary sphincterotomy was performed & the biliary tree was swept and sludge was found. CLD diet as per GI. GI following and recs apprec. Oxy, morphine  prn for pain  Smoker: smokes 4-5 cigarettes a daily. Received smoking cessation counseling already    HIV: continue on home dose of biktarvy      DVT prophylaxis: SCDs Code Status: full Family Communication: Disposition Plan: likely d/c back home.  Level of care: Telemetry Medical  Status is: Inpatient Remains inpatient appropriate because: severity of illness    Consultants:  GI  Procedures:   Antimicrobials:    Subjective: Pt c/o abd pain  Objective: Vitals:   07/15/24 1600 07/15/24 1610 07/15/24 1620 07/15/24 1702  BP: 92/62 118/69 113/80 124/61  Pulse: 85 75 70 71  Resp: 19 16 20 18   Temp:    98.4 F (36.9 C)  TempSrc:    Oral  SpO2: 91% 90% 90% 90%  Weight:      Height:        Intake/Output Summary (Last 24 hours) at 07/15/2024 1905 Last data filed at 07/15/2024 0319 Gross per 24 hour  Intake 1098.33 ml  Output --  Net 1098.33 ml   Filed Weights   07/14/24 0921 07/15/24 0307 07/15/24 1347  Weight: 77.1 kg 76.4 kg 76.4 kg    Examination:  General exam: Appears calm and comfortable  Respiratory system: Clear to auscultation. Respiratory effort normal. Cardiovascular system: S1 & S2+. No JVD, murmurs, rubs, gallops or clicks.  Gastrointestinal system: Abdomen is nondistended, soft and nontender.  Normal bowel sounds heard. Central nervous system: Alert and oriented.Moves all extremities  Psychiatry: Judgement and insight appear normal. Flat mood and affect      Data Reviewed: I have personally reviewed following labs and imaging studies  CBC: Recent Labs  Lab 07/14/24 0923 07/15/24 0336  WBC 6.6 5.3  HGB 15.9 14.2  HCT 47.4 42.0  MCV 96.0 97.4  PLT 228 198   Basic Metabolic Panel: Recent Labs  Lab 07/14/24 0923 07/14/24 1621 07/15/24 0336  NA 140  --  137  K 3.4*  --  3.6  CL 106  --  107  CO2 23  --  25  GLUCOSE 118*  --  106*  BUN 8  --  6  CREATININE 0.83  --  0.76  CALCIUM 9.3  --  8.5*  MG  --  1.7  --    GFR: Estimated Creatinine Clearance: 100.8 mL/min (by C-G formula based on SCr of 0.76 mg/dL). Liver Function Tests: Recent Labs  Lab 07/14/24 0923  AST 20  ALT 16  ALKPHOS 76  BILITOT 0.6  PROT 7.3  ALBUMIN 3.7   Recent Labs  Lab 07/14/24 0923  LIPASE 55*   No results for input(s): AMMONIA in the last 168 hours. Coagulation Profile: No results for input(s): INR, PROTIME in the last 168 hours. Cardiac Enzymes: No results for input(s): CKTOTAL, CKMB, CKMBINDEX, TROPONINI in the last 168 hours. BNP (last 3 results) No results for input(s): PROBNP in the last 8760 hours. HbA1C: No results for input(s): HGBA1C in the last 72 hours. CBG: No results for input(s): GLUCAP  in the last 168 hours. Lipid Profile: No results for input(s): CHOL, HDL, LDLCALC, TRIG, CHOLHDL, LDLDIRECT in the last 72 hours. Thyroid Function Tests: No results for input(s): TSH, T4TOTAL, FREET4, T3FREE, THYROIDAB in the last 72 hours. Anemia Panel: No results for input(s): VITAMINB12, FOLATE, FERRITIN, TIBC, IRON, RETICCTPCT in the last 72 hours. Sepsis Labs: No results for input(s): PROCALCITON, LATICACIDVEN in the last 168 hours.  No results found for this or any previous visit (from the past 240 hours).       Radiology Studies: DG C-Arm 1-60 Min-No Report Result Date: 07/15/2024 Fluoroscopy was utilized by the requesting physician.  No radiographic  interpretation.   MR ABDOMEN MRCP W WO CONTAST Result Date: 07/14/2024 CLINICAL DATA:  Right upper quadrant abdominal pain nausea and vomiting EXAM: MRI ABDOMEN WITHOUT AND WITH CONTRAST (INCLUDING MRCP) TECHNIQUE: Multiplanar multisequence MR imaging of the abdomen was performed both before and after the administration of intravenous contrast. Heavily T2-weighted images of the biliary and pancreatic ducts were obtained, and three-dimensional MRCP images were rendered by post processing. CONTRAST:  7mL GADAVIST  GADOBUTROL  1 MMOL/ML IV SOLN COMPARISON:  CT scan 07/14/2024 FINDINGS: Lower chest: Unremarkable Hepatobiliary: Mild hepatic steatosis. No gallbladder wall thickening. Common bile duct mildly dilated at 0.8 cm in diameter with a truncated distal margin and meniscus appearance as on image 74 series 14 favoring distal choledocholithiasis. No significant wall enhancement in the distal CBD. No abnormal enhancing lesion in the pancreatic head along the distal common bile duct. No significant intrahepatic biliary dilatation. The gallbladder appears unremarkable.  No pericholecystic fluid. Pancreas:  Partial pancreas divisum. Spleen:  Unremarkable Adrenals/Urinary Tract:  Unremarkable Stomach/Bowel: Transverse duodenal diverticulum. Vascular/Lymphatic:  Abdominal aortic atherosclerosis. Other:  No supplemental non-categorized findings. Musculoskeletal: Unremarkable IMPRESSION: 1. Mild dilatation of the common bile duct at 0.8 cm in diameter with a truncated distal margin and meniscus appearance favoring distal choledocholithiasis. No significant wall enhancement in the distal CBD. No abnormal enhancing lesion in the pancreatic head along the distal common bile duct. 2. Mild hepatic steatosis. 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 5.  Aortic Atherosclerosis (ICD10-I70.0). Electronically Signed   By: Ryan Salvage M.D.   On: 07/14/2024 14:32   MR 3D Recon At Scanner Result  Date: 07/14/2024 CLINICAL DATA:  Right upper quadrant abdominal pain nausea and vomiting EXAM: MRI ABDOMEN WITHOUT AND WITH CONTRAST (INCLUDING MRCP) TECHNIQUE: Multiplanar multisequence MR imaging of the abdomen was performed both before and after the administration of intravenous contrast. Heavily T2-weighted images of the biliary and pancreatic ducts were obtained, and three-dimensional MRCP images were rendered by post processing. CONTRAST:  7mL GADAVIST  GADOBUTROL  1 MMOL/ML IV SOLN COMPARISON:  CT scan 07/14/2024 FINDINGS: Lower chest: Unremarkable Hepatobiliary: Mild hepatic steatosis. No gallbladder wall thickening. Common bile duct mildly dilated at 0.8 cm in diameter with a truncated distal margin and meniscus appearance as on image 74 series 14 favoring distal choledocholithiasis. No significant wall enhancement in the distal CBD. No abnormal enhancing lesion in the pancreatic head along the distal common bile duct. No significant intrahepatic biliary dilatation. The gallbladder appears unremarkable.  No pericholecystic fluid. Pancreas:  Partial pancreas divisum. Spleen:  Unremarkable Adrenals/Urinary Tract:  Unremarkable Stomach/Bowel: Transverse duodenal diverticulum. Vascular/Lymphatic:  Abdominal aortic atherosclerosis. Other:  No supplemental non-categorized findings. Musculoskeletal: Unremarkable IMPRESSION: 1. Mild dilatation of the common bile duct at 0.8 cm in diameter with a truncated distal margin and meniscus appearance favoring distal choledocholithiasis. No significant wall enhancement in the distal CBD. No abnormal enhancing  lesion in the pancreatic head along the distal common bile duct. 2. Mild hepatic steatosis. 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 5.  Aortic Atherosclerosis (ICD10-I70.0). Electronically Signed   By: Ryan Salvage M.D.   On: 07/14/2024 14:32   CT ABDOMEN PELVIS W CONTRAST Result Date: 07/14/2024 CLINICAL DATA:  Acute nonlocalized  abdominal pain EXAM: CT ABDOMEN AND PELVIS WITH CONTRAST TECHNIQUE: Multidetector CT imaging of the abdomen and pelvis was performed using the standard protocol following bolus administration of intravenous contrast. RADIATION DOSE REDUCTION: This exam was performed according to the departmental dose-optimization program which includes automated exposure control, adjustment of the mA and/or kV according to patient size and/or use of iterative reconstruction technique. CONTRAST:  OMNIPAQUE  IOHEXOL  300 MG/ML  SOLN COMPARISON:  09/16/2017 FINDINGS: Lower chest: No acute abnormality.  Small hiatal hernia Hepatobiliary: Mild hepatic steatosis. No enhancing intrahepatic mass. The extrahepatic bile duct is mildly dilated measuring up to 10 mm in diameter and there is a a subtle filling defect within the distal duct at the level of the into a, best seen on image # 38/2 and 44/5, suspicious for intraductal debris or calculi. No intrahepatic biliary ductal dilation. Gallbladder unremarkable. Pancreas: Unremarkable Spleen: Unremarkable Adrenals/Urinary Tract: Adrenal glands are unremarkable. Kidneys are normal, without renal calculi, focal lesion, or hydronephrosis. Bladder is unremarkable. Stomach/Bowel: Stomach is within normal limits. Appendix appears normal. No evidence of bowel wall thickening, distention, or inflammatory changes. No free intraperitoneal gas or fluid Vascular/Lymphatic: Aortic atherosclerosis. No enlarged abdominal or pelvic lymph nodes. Reproductive: Prostate is unremarkable. Other: None significant Musculoskeletal: No acute or significant osseous findings. IMPRESSION: 1. Mild dilation of the extrahepatic bile duct measuring up to 10 mm in diameter with a subtle filling defect within the distal duct at the level of the ampulla, suspicious for intraductal debris or calculi. Correlate with liver enzymes and consider further evaluation with MRCP. 2. Mild hepatic steatosis. 3. Small hiatal hernia.  Electronically Signed   By: Dorethia Molt M.D.   On: 07/14/2024 11:57        Scheduled Meds:  bictegravir-emtricitabine -tenofovir  AF  1 tablet Oral Daily   pantoprazole   40 mg Oral Daily   Continuous Infusions:   LOS: 1 day       Anthony CHRISTELLA Pouch, MD [facility] Pager [phone]  If 7PM-7AM, please contact night-coverage [email or web address] 07/15/2024, 7:05 PM

## 2024-07-15 NOTE — Op Note (Signed)
 [facility] Gastroenterology Patient Name: Timothy Salas Procedure Date: 07/15/2024 2:45 PM MRN: [id number] Account #: [id number] Date of Birth: May 17, 1971 Admit Type: Inpatient Age: 53 Room: [facility] ENDO ROOM 4 Gender: Male Note Status: Finalized Instrument Name: [id number] Procedure:             ERCP Indications:           Abnormal MRCP Providers:             [name] MD, MD Referring MD:          No Local Md, MD (Referring MD) Medicines:             General Anesthesia Complications:         Laryngospasm Procedure:             Pre-Anesthesia Assessment:                        - Prior to the procedure, a History and Physical was                         performed, and patient medications and allergies were                         reviewed. The patient's tolerance of previous                         anesthesia was also reviewed. The risks and benefits                         of the procedure and the sedation options and risks                         were discussed with the patient. All questions were                         answered, and informed consent was obtained. Prior                         Anticoagulants: The patient has taken no anticoagulant                         or antiplatelet agents. ASA Grade Assessment: II - A                         patient with mild systemic disease. After reviewing                         the risks and benefits, the patient was deemed in                         satisfactory condition to undergo the procedure.                        After obtaining informed consent, the scope was passed                         under direct vision. Throughout the procedure, the  patient's blood pressure, pulse, and oxygen                         saturations were monitored continuously. The                         Duodenoscope was introduced through the mouth, and                         used to inject contrast into  and used to inject                         contrast into the bile duct. The ERCP was accomplished                         without difficulty. The patient tolerated the                         procedure well. Findings:      The scout film was normal. A standard esophagogastroduodenoscopy scope       was used for the examination of the upper gastrointestinal tract. The       scope was passed under direct vision through the upper GI tract. One       benign-appearing, intrinsic mild stenosis was found in the upper third       of the esophagus. The stenosis was traversed after dilation. The major       papilla was some distance away from a diverticulum. The bile duct was       deeply cannulated with the short-nosed traction sphincterotome. Contrast       was injected. I personally interpreted the bile duct images. There was       brisk flow of contrast through the ducts. Image quality was excellent.       Contrast extended to the entire biliary tree. The lower third of the       main bile duct contained filling defect(s). A wire was passed into the       biliary tree. The biliary sphincterotomy was extended to a total of 8 mm       in length with a traction (standard) sphincterotome using ERBE       electrocautery. There was no post-sphincterotomy bleeding. To discover       objects, the biliary tree was swept with a 15 mm balloon starting at the       bifurcation. Sludge was swept from the duct. A TTS dilator was passed       through the scope. Dilation with a 15-16.5-18 mm balloon dilator was       performed to 16.5 mm in the upper third of the esophagus. Impression:            - Benign-appearing esophageal stenosis.                        - The major papilla was some distance away from a                         diverticulum.                        - Dilation performed in the upper third of the  esophagus.                        - A filling defect was seen on the  cholangiogram.                        - A biliary sphincterotomy was performed.                        - The biliary tree was swept and sludge was found. Recommendation:        - Return patient to hospital ward for ongoing care.                        - Clear liquid diet today.                        - Watch for pancreatitis, bleeding, perforation, and                         cholangitis. Procedure Code(s):     --- Professional ---                        [phone], Endoscopic retrograde cholangiopancreatography                         (ERCP); with removal of calculi/debris from                         biliary/pancreatic duct(s)                        43262, Endoscopic retrograde cholangiopancreatography                         (ERCP); with sphincterotomy/papillotomy                        43249, Esophagogastroduodenoscopy, flexible,                         transoral; with transendoscopic balloon dilation of                         esophagus (less than 30 mm diameter)                        74328, Endoscopic catheterization of the biliary                         ductal system, radiological supervision and                         interpretation Diagnosis Code(s):     --- Professional ---                        R93.2, Abnormal findings on diagnostic imaging of                         liver and biliary tract  K22.2, Esophageal obstruction CPT copyright 2022 American Medical Association. All rights reserved. The codes documented in this report are preliminary and upon coder review may  be revised to meet current compliance requirements. [name] MD, MD 07/15/2024 3:35:57 PM This report has been signed electronically. Number of Addenda: 0 Note Initiated On: 07/15/2024 2:45 PM Estimated Blood Loss:  Estimated blood loss: none. Estimated blood loss: none.      [facility]

## 2024-07-15 NOTE — Anesthesia Preprocedure Evaluation (Signed)
 Anesthesia Evaluation  Patient identified by MRN, date of birth, ID band Patient awake and Patient confused    Reviewed: Allergy & Precautions, NPO status , Patient's Chart, lab work & pertinent test results  Airway Mallampati: II  TM Distance: >3 FB Neck ROM: full    Dental  (+) Upper Dentures, Partial Lower   Pulmonary neg pulmonary ROS, COPD, Current Smoker and Patient abstained from smoking.   Pulmonary exam normal  + decreased breath sounds      Cardiovascular Exercise Tolerance: Good negative cardio ROS Normal cardiovascular exam Rhythm:Regular Rate:Normal     Neuro/Psych negative neurological ROS  negative psych ROS   GI/Hepatic negative GI ROS, Neg liver ROS,,,  Endo/Other  negative endocrine ROS    Renal/GU negative Renal ROS  negative genitourinary   Musculoskeletal   Abdominal Normal abdominal exam  (+)   Peds negative pediatric ROS (+)  Hematology negative hematology ROS (+)   Anesthesia Other Findings Past Medical History: No date: Hepatic steatosis No date: HIV (human immunodeficiency virus infection) (HCC)  History reviewed. No pertinent surgical history.  BMI    Body Mass Index: 28.03 kg/m      Reproductive/Obstetrics negative OB ROS                              Anesthesia Physical Anesthesia Plan  ASA: 2  Anesthesia Plan: General   Post-op Pain Management:    Induction: Intravenous  PONV Risk Score and Plan: Propofol  infusion and TIVA  Airway Management Planned: Natural Airway and Nasal Cannula  Additional Equipment:   Intra-op Plan:   Post-operative Plan:   Informed Consent: I have reviewed the patients History and Physical, chart, labs and discussed the procedure including the risks, benefits and alternatives for the proposed anesthesia with the patient or authorized representative who has indicated his/her understanding and acceptance.      Dental Advisory Given  Plan Discussed with: CRNA  Anesthesia Plan Comments:         Anesthesia Quick Evaluation

## 2024-07-15 NOTE — Transfer of Care (Signed)
 Immediate Anesthesia Transfer of Care Note  Patient: Timothy Salas.  Procedure(s) Performed: ERCP, WITH INTERVENTION IF INDICATED Balloon dilation wire-guided  Patient Location: Endoscopy Unit  Anesthesia Type:General  Level of Consciousness: drowsy  Airway & Oxygen Therapy: Patient Spontanous Breathing and Patient connected to nasal cannula oxygen  Post-op Assessment: Report given to RN, Post -op Vital signs reviewed and stable, and Patient moving all extremities  Post vital signs: Reviewed and stable  Last Vitals:  Vitals Value Taken Time  BP 106/67 07/15/24 15:49  Temp    Pulse 80 07/15/24 15:52  Resp 18 07/15/24 15:52  SpO2 94 % 07/15/24 15:52  Vitals shown include unfiled device data.  Last Pain:  Vitals:   07/15/24 1347  TempSrc: Temporal  PainSc: 2          Complications: No notable events documented.

## 2024-07-15 NOTE — ED Notes (Signed)
Patients bed is ready

## 2024-07-15 NOTE — Anesthesia Procedure Notes (Signed)
 Procedure Name: Intubation Date/Time: 07/15/2024 3:09 PM  Performed by: Landy Francena BIRCH, CRNAPre-anesthesia Checklist: Patient identified, Emergency Drugs available, Suction available and Patient being monitored Patient Re-evaluated:Patient Re-evaluated prior to induction Oxygen Delivery Method: Circle system utilized Preoxygenation: Pre-oxygenation with 100% oxygen Induction Type: IV induction and Rapid sequence Ventilation: Mask ventilation with difficulty and Oral airway inserted - appropriate to patient size Laryngoscope Size: McGrath and 3 Grade View: Grade II Tube type: Oral Tube size: 7.0 mm Number of attempts: 1 Airway Equipment and Method: Stylet, Oral airway and Bite block Placement Confirmation: ETT inserted through vocal cords under direct vision, positive ETCO2 and breath sounds checked- equal and bilateral Secured at: 22 cm Tube secured with: Tape Dental Injury: Teeth and Oropharynx as per pre-operative assessment

## 2024-07-15 NOTE — TOC CM/SW Note (Signed)
 Transition of Care [facility]) - Inpatient Brief Assessment   Patient Details  Name: Timothy Salas. MRN: [id number] Date of Birth: [birth year]  Transition of Care [facility]) CM/SW Contact:    Asberry CHRISTELLA Jaksch, RN Phone Number: 07/15/2024, 9:15 AM   Clinical Narrative:   Transition of Care [facility]) Screening Note   Patient Details  Name: Timothy Salas. Date of Birth: 1971-11-28   Transition of Care [facility]) CM/SW Contact:    Asberry CHRISTELLA Jaksch, RN Phone Number: 07/15/2024, 9:15 AM    Transition of Care Department [facility]) has reviewed patient and no TOC needs have been identified at this time.If new patient transition needs arise, please place a TOC consult.  Patient noted to have no insurance on file. Resources for [facility] added to AVS should it be needed.     Transition of Care Asessment: Insurance and Status: Selfpay Patient has primary care physician: Yes     Prior/Current Home Services: No current home services Social Drivers of Health Review: SDOH reviewed no interventions necessary Readmission risk has been reviewed: Yes Transition of care needs: no transition of care needs at this time

## 2024-07-16 ENCOUNTER — Inpatient Hospital Stay: Payer: Self-pay

## 2024-07-16 DIAGNOSIS — K567 Ileus, unspecified: Secondary | ICD-10-CM

## 2024-07-16 LAB — COMPREHENSIVE METABOLIC PANEL WITH GFR
ALT: 26 U/L (ref 0–44)
AST: 40 U/L (ref 15–41)
Albumin: 3.3 g/dL — ABNORMAL LOW (ref 3.5–5.0)
Alkaline Phosphatase: 65 U/L (ref 38–126)
Anion gap: 9 (ref 5–15)
BUN: 6 mg/dL (ref 6–20)
CO2: 25 mmol/L (ref 22–32)
Calcium: 9 mg/dL (ref 8.9–10.3)
Chloride: 104 mmol/L (ref 98–111)
Creatinine, Ser: 0.7 mg/dL (ref 0.61–1.24)
GFR, Estimated: >60 mL/min (ref >60)
Glucose, Bld: 122 mg/dL — ABNORMAL HIGH (ref 70–99)
Potassium: 3.9 mmol/L (ref 3.5–5.1)
Sodium: 138 mmol/L (ref 135–145)
Total Bilirubin: 1.1 mg/dL (ref 0.0–1.2)
Total Protein: 6.7 g/dL (ref 6.5–8.1)

## 2024-07-16 LAB — CBC
HCT: 43.1 % (ref 39.0–52.0)
Hemoglobin: 14.9 g/dL (ref 13.0–17.0)
MCH: 33.1 pg (ref 26.0–34.0)
MCHC: 34.6 g/dL (ref 30.0–36.0)
MCV: 95.8 fL (ref 80.0–100.0)
Platelets: 218 K/uL (ref 150–400)
RBC: 4.5 MIL/uL (ref 4.22–5.81)
RDW: 13.1 % (ref 11.5–15.5)
WBC: 14.2 K/uL — ABNORMAL HIGH (ref 4.0–10.5)
nRBC: 0 % (ref 0.0–0.2)

## 2024-07-16 MED ORDER — SCOPOLAMINE 1 MG/3DAYS TD PT72
1.0000 | MEDICATED_PATCH | TRANSDERMAL | Status: DC
  Administered 2024-07-16 – 2024-07-19 (×2): 1.5 mg via TRANSDERMAL
  Filled 2024-07-16 (×2): qty 1

## 2024-07-16 MED ORDER — SODIUM CHLORIDE 0.9 % IV SOLN: INTRAVENOUS | Status: DC

## 2024-07-16 MED ORDER — DIATRIZOATE MEGLUMINE & SODIUM 66-10 % PO SOLN: 90.0000 mL | Freq: Once | ORAL | Status: DC

## 2024-07-16 MED ORDER — HYDROMORPHONE HCL 1 MG/ML IJ SOLN
1.0000 mg | INTRAMUSCULAR | Status: DC | PRN
  Administered 2024-07-16 – 2024-07-18 (×10): 1 mg via INTRAVENOUS
  Filled 2024-07-16 (×10): qty 1

## 2024-07-16 MED ORDER — SODIUM CHLORIDE 0.9 % IV SOLN
12.5000 mg | Freq: Four times a day (QID) | INTRAVENOUS | Status: DC | PRN
  Administered 2024-07-16: 12.5 mg via INTRAVENOUS
  Filled 2024-07-16: qty 12.5

## 2024-07-16 NOTE — Progress Notes (Signed)
 Timothy Copping, MD [facility]   [address] [place], [place] [address] Phone: [phone] Fax : [phone]   Subjective: This patient underwent an ERCP yesterday for abdominal pain with a positive MRCP with a report of nausea and vomiting.  He continues to have abdominal discomfort and states that he is not able to pass gas and his stomach is more distended today.  The patient's liver enzymes this morning were normal but his CBC showed an elevation of his white cell count to 14.2.   Objective: Vital signs in last 24 hours: Vitals:   07/15/24 1702 07/15/24 2002 07/16/24 0337 07/16/24 0839  BP: 124/61 139/86 (!) 140/93 (!) 145/76  Pulse: 71 68 81 66  Resp: 18 16 16 18   Temp: 98.4 F (36.9 C) 98.1 F (36.7 C) 98.1 F (36.7 C) 97.8 F (36.6 C)  TempSrc: Oral Oral Oral Oral  SpO2: 90% 94% 93% 95%  Weight:      Height:       Weight change: -0.712 kg  Intake/Output Summary (Last 24 hours) at 07/16/2024 0845 Last data filed at 07/15/2024 2005 Gross per 24 hour  Intake 240 ml  Output --  Net 240 ml     Exam: Heart:: Regular rate and rhythm or without murmur or extra heart sounds Lungs: normal and clear to auscultation and percussion Abdomen: Distended and tympanic with slightly decreased bowel sounds   Lab Results: @LABTEST2 @ Micro Results: No results found for this or any previous visit (from the past 240 hours). Studies/Results: DG C-Arm 1-60 Min-No Report Result Date: 07/15/2024 Fluoroscopy was utilized by the requesting physician.  No radiographic interpretation.   MR ABDOMEN MRCP W WO CONTAST Result Date: 07/14/2024 CLINICAL DATA:  Right upper quadrant abdominal pain nausea and vomiting EXAM: MRI ABDOMEN WITHOUT AND WITH CONTRAST (INCLUDING MRCP) TECHNIQUE: Multiplanar multisequence MR imaging of the abdomen was performed both before and after the administration of intravenous contrast. Heavily T2-weighted images of the biliary and pancreatic ducts were obtained,  and three-dimensional MRCP images were rendered by post processing. CONTRAST:  7mL GADAVIST  GADOBUTROL  1 MMOL/ML IV SOLN COMPARISON:  CT scan 07/14/2024 FINDINGS: Lower chest: Unremarkable Hepatobiliary: Mild hepatic steatosis. No gallbladder wall thickening. Common bile duct mildly dilated at 0.8 cm in diameter with a truncated distal margin and meniscus appearance as on image 74 series 14 favoring distal choledocholithiasis. No significant wall enhancement in the distal CBD. No abnormal enhancing lesion in the pancreatic head along the distal common bile duct. No significant intrahepatic biliary dilatation. The gallbladder appears unremarkable.  No pericholecystic fluid. Pancreas:  Partial pancreas divisum. Spleen:  Unremarkable Adrenals/Urinary Tract:  Unremarkable Stomach/Bowel: Transverse duodenal diverticulum. Vascular/Lymphatic:  Abdominal aortic atherosclerosis. Other:  No supplemental non-categorized findings. Musculoskeletal: Unremarkable IMPRESSION: 1. Mild dilatation of the common bile duct at 0.8 cm in diameter with a truncated distal margin and meniscus appearance favoring distal choledocholithiasis. No significant wall enhancement in the distal CBD. No abnormal enhancing lesion in the pancreatic head along the distal common bile duct. 2. Mild hepatic steatosis. 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 5.  Aortic Atherosclerosis (ICD10-I70.0). Electronically Signed   By: Ryan Salvage M.D.   On: 07/14/2024 14:32   MR 3D Recon At Scanner Result Date: 07/14/2024 CLINICAL DATA:  Right upper quadrant abdominal pain nausea and vomiting EXAM: MRI ABDOMEN WITHOUT AND WITH CONTRAST (INCLUDING MRCP) TECHNIQUE: Multiplanar multisequence MR imaging of the abdomen was performed both before and after the administration of intravenous contrast. Heavily T2-weighted images of  the biliary and pancreatic ducts were obtained, and three-dimensional MRCP images were rendered by post  processing. CONTRAST:  7mL GADAVIST  GADOBUTROL  1 MMOL/ML IV SOLN COMPARISON:  CT scan 07/14/2024 FINDINGS: Lower chest: Unremarkable Hepatobiliary: Mild hepatic steatosis. No gallbladder wall thickening. Common bile duct mildly dilated at 0.8 cm in diameter with a truncated distal margin and meniscus appearance as on image 74 series 14 favoring distal choledocholithiasis. No significant wall enhancement in the distal CBD. No abnormal enhancing lesion in the pancreatic head along the distal common bile duct. No significant intrahepatic biliary dilatation. The gallbladder appears unremarkable.  No pericholecystic fluid. Pancreas:  Partial pancreas divisum. Spleen:  Unremarkable Adrenals/Urinary Tract:  Unremarkable Stomach/Bowel: Transverse duodenal diverticulum. Vascular/Lymphatic:  Abdominal aortic atherosclerosis. Other:  No supplemental non-categorized findings. Musculoskeletal: Unremarkable IMPRESSION: 1. Mild dilatation of the common bile duct at 0.8 cm in diameter with a truncated distal margin and meniscus appearance favoring distal choledocholithiasis. No significant wall enhancement in the distal CBD. No abnormal enhancing lesion in the pancreatic head along the distal common bile duct. 2. Mild hepatic steatosis. 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 5.  Aortic Atherosclerosis (ICD10-I70.0). Electronically Signed   By: Ryan Salvage M.D.   On: 07/14/2024 14:32   CT ABDOMEN PELVIS W CONTRAST Result Date: 07/14/2024 CLINICAL DATA:  Acute nonlocalized abdominal pain EXAM: CT ABDOMEN AND PELVIS WITH CONTRAST TECHNIQUE: Multidetector CT imaging of the abdomen and pelvis was performed using the standard protocol following bolus administration of intravenous contrast. RADIATION DOSE REDUCTION: This exam was performed according to the departmental dose-optimization program which includes automated exposure control, adjustment of the mA and/or kV according to patient size and/or  use of iterative reconstruction technique. CONTRAST:  OMNIPAQUE  IOHEXOL  300 MG/ML  SOLN COMPARISON:  09/16/2017 FINDINGS: Lower chest: No acute abnormality.  Small hiatal hernia Hepatobiliary: Mild hepatic steatosis. No enhancing intrahepatic mass. The extrahepatic bile duct is mildly dilated measuring up to 10 mm in diameter and there is a a subtle filling defect within the distal duct at the level of the into a, best seen on image # 38/2 and 44/5, suspicious for intraductal debris or calculi. No intrahepatic biliary ductal dilation. Gallbladder unremarkable. Pancreas: Unremarkable Spleen: Unremarkable Adrenals/Urinary Tract: Adrenal glands are unremarkable. Kidneys are normal, without renal calculi, focal lesion, or hydronephrosis. Bladder is unremarkable. Stomach/Bowel: Stomach is within normal limits. Appendix appears normal. No evidence of bowel wall thickening, distention, or inflammatory changes. No free intraperitoneal gas or fluid Vascular/Lymphatic: Aortic atherosclerosis. No enlarged abdominal or pelvic lymph nodes. Reproductive: Prostate is unremarkable. Other: None significant Musculoskeletal: No acute or significant osseous findings. IMPRESSION: 1. Mild dilation of the extrahepatic bile duct measuring up to 10 mm in diameter with a subtle filling defect within the distal duct at the level of the ampulla, suspicious for intraductal debris or calculi. Correlate with liver enzymes and consider further evaluation with MRCP. 2. Mild hepatic steatosis. 3. Small hiatal hernia. Electronically Signed   By: Dorethia Molt M.D.   On: 07/14/2024 11:57   Medications: I have reviewed the patient's current medications. Scheduled Meds:  bictegravir-emtricitabine -tenofovir  AF  1 tablet Oral Daily   pantoprazole   40 mg Oral Daily   Continuous Infusions: PRN Meds:.acetaminophen  **OR** acetaminophen , morphine  injection, nicotine , ondansetron  **OR** ondansetron  (ZOFRAN ) IV, oxyCODONE -acetaminophen ,  senna-docusate   Assessment: Principal Problem:   Common bile duct (CBD) obstruction Active Problems:   HIV DISEASE   TOBACCO USER   Abdominal pain    Plan: This patient appears to have ileus  post ERCP yesterday.  I have ordered a KUB for this morning.  The patient is presently tolerating clear liquids without a problem.  The patient has been told to try and walk around to help mobilize the intestinal gas.  Further recommendations pending KUB results.   LOS: 2 days   Timothy Copping, MD.FACG 07/16/2024, 8:45 AM Pager [phone] 7am-5pm  Check AMION for 5pm -7am coverage and on weekends

## 2024-07-16 NOTE — Consult Note (Signed)
 [facility]  SURGICAL CONSULTATION NOTE    HISTORY OF PRESENT ILLNESS (HPI):  53 y.o. male presented to [facility] ED on 07/14/24 for evaluation of generalized abdominal pain, nausea/vomiting and 1 episode of diarrhea that morning. States his symptoms started the day before.  Pain radiates to right upper quadrant pain. No known aggravating or alleviating factors.  Labs show known leukocytosis with a white blood count of 6.6, normal hemoglobin of 15.9.  Patient had normal LFTs, alka phos, and normal total bilirubin.  His lipase was slightly elevated at 55.  CT of abdomen showed mild dilation of the extrahepatic bile duct and subtle filling defect suspicious for intraductal debris or calculi.  No evidence of small bowel obstruction or ileus. MRCP was then ordered which was positive for choledocholithiasis.  GI was consulted.  ERCP was done yesterday by Dr. Jinny, successfully removing sludge and biliary sphincterotomy.   This morning patient is presenting with generalized abdominal pain, nausea, vomiting, and reports feeling distended.  States he is feeling more full than usual.  His last bowel movement was 2 days ago.  He admits to not being able to pass gas.  Pain medication seem to improve symptoms.  No known aggravating factors.  Patient denies any recent abdominal surgeries.  States he had an abscess in intestine in the 1990s.   This morning patient was afebrile and hypertensive with a BP of 145/76,  heart rate of 66 and RR of 18.  His labs post-ERCP show an elevated white blood count of 14.2 and normal hemoglobin of 14.9.  Rest of labs including LFTs, alkaline phosphatase, and total bilirubin are within normal limits. Abdominal x-ray has been ordered by GI.   Surgery is consulted physician Dr. Trudy in this context for evaluation and management of ileus vs small bowel obstruction.   PAST MEDICAL HISTORY (PMH):  Past Medical History:  Diagnosis Date   Hepatic steatosis    HIV  (human immunodeficiency virus infection) (HCC)      PAST SURGICAL HISTORY (PSH):  Past Surgical History:  Procedure Laterality Date   ERCP N/A 07/15/2024   Procedure: ERCP, WITH INTERVENTION IF INDICATED;  Surgeon: Jinny Carmine, MD;  Location: [facility] ENDOSCOPY;  Service: Endoscopy;  Laterality: N/A;   ESOPHAGOGASTRODUODENOSCOPY  07/15/2024   Procedure: EGD (ESOPHAGOGASTRODUODENOSCOPY);  Surgeon: Jinny Carmine, MD;  Location: [facility] ENDOSCOPY;  Service: Endoscopy;;     MEDICATIONS:  Prior to Admission medications   Medication Sig Start Date End Date Taking? Authorizing Provider  BIKTARVY  50-200-25 MG TABS tablet Take 1 tablet by mouth daily.   Yes [provider]  omeprazole (PRILOSEC) 20 MG capsule Take 20 mg by mouth daily.   Yes [provider]  cyclobenzaprine  (FLEXERIL ) 10 MG tablet Take 1 tablet (10 mg total) by mouth 3 (three) times daily as needed. Patient not taking: Reported on 07/14/2024 04/22/24   Jacolyn Pae, MD  elvitegravir-cobicistat-emtricitabine -tenofovir  (STRIBILD) 150-150-200-300 MG TABS tablet Take 1 tablet by mouth daily with breakfast. Patient not taking: Reported on 07/14/2024 08/25/14   Efrain Lamar ORN, MD  ibuprofen  (ADVIL ) 600 MG tablet Take 1 tablet (600 mg total) by mouth every 6 (six) hours as needed. Patient not taking: Reported on 07/14/2024 04/22/24   Jacolyn Pae, MD  Imiquimod Pump 3.75 % CREA Apply 7.5 g topically daily. Patient not taking: Reported on 07/14/2024 07/13/24   [provider]     ALLERGIES:  Allergies  Allergen Reactions   Amoxicillin Dermatitis, Hives, Itching and Nausea Only    REACTION:  hives     SOCIAL HISTORY:  Social History   Socioeconomic History   Marital status: Single    Spouse name: Not on file   Number of children: Not on file   Years of education: Not on file   Highest education level: Not on file  Occupational History   Not on file  Tobacco Use   Smoking status: Every Day     Current packs/day: 0.50    Types: Cigarettes   Smokeless tobacco: Never   Tobacco comments:    e-cigarettes  Vaping Use   Vaping status: Never Used  Substance and Sexual Activity   Alcohol use: Yes    Comment: occassionally   Drug use: Yes    Types: Marijuana    Comment: daily use   Sexual activity: Yes    Partners: Male    Comment: pt declines condoms  Other Topics Concern   Not on file  Social History Narrative   Not on file   Social Drivers of Health   Financial Resource Strain: Not on file  Food Insecurity: No Food Insecurity (07/15/2024)   Hunger Vital Sign    Worried About Running Out of Food in the Last Year: Never true    Ran Out of Food in the Last Year: Never true  Transportation Needs: No Transportation Needs (07/15/2024)   PRAPARE - Administrator, Civil Service (Medical): No    Lack of Transportation (Non-Medical): No  Physical Activity: Not on file  Stress: Not on file  Social Connections: Not on file  Intimate Partner Violence: Not At Risk (07/15/2024)   Humiliation, Afraid, Rape, and Kick questionnaire    Fear of Current or Ex-Partner: No    Emotionally Abused: No    Physically Abused: No    Sexually Abused: No     FAMILY HISTORY:  Family History  Problem Relation Age of Onset   Heart disease Father    Diabetes Maternal Grandmother    Diabetes Paternal Grandmother    Heart disease Paternal Grandfather       REVIEW OF SYSTEMS:  Review of Systems  Constitutional:  Negative for chills and fever.  Respiratory:  Negative for shortness of breath and wheezing.   Cardiovascular:  Negative for chest pain and palpitations.  Gastrointestinal:  Positive for abdominal pain, nausea and vomiting.    VITAL SIGNS:  Temp:  [96.4 F (35.8 C)-98.4 F (36.9 C)] 97.8 F (36.6 C) (07/24 0839) Pulse Rate:  [66-85] 66 (07/24 0839) Resp:  [15-20] 18 (07/24 0839) BP: (92-145)/(61-93) 145/76 (07/24 0839) SpO2:  [90 %-98 %] 95 % (07/24 0839) Weight:   [76.4 kg] 76.4 kg (07/23 1347)     Height: 5' 5 (165.1 cm) Weight: 76.4 kg BMI (Calculated): 28.03   INTAKE/OUTPUT:  07/23 0701 - 07/24 0700 In: 240 [P.O.:240] Out: -   PHYSICAL EXAM:  Physical Exam Constitutional:      Appearance: He is well-developed.  HENT:     Head: Normocephalic and atraumatic.  Cardiovascular:     Rate and Rhythm: Normal rate and regular rhythm.  Pulmonary:     Effort: Pulmonary effort is normal.     Breath sounds: Normal breath sounds.  Abdominal:     General: There is distension.     Palpations: Abdomen is soft.     Tenderness: There is generalized abdominal tenderness.  Neurological:     Mental Status: He is alert.     Labs:     Latest Ref Rng & Units  07/16/2024    3:46 AM 07/15/2024    3:36 AM 07/14/2024    9:23 AM  CBC  WBC 4.0 - 10.5 K/uL 14.2  5.3  6.6   Hemoglobin 13.0 - 17.0 g/dL 85.0  85.7  84.0   Hematocrit 39.0 - 52.0 % 43.1  42.0  47.4   Platelets 150 - 400 K/uL 218  198  228       Latest Ref Rng & Units 07/16/2024    3:46 AM 07/15/2024    3:36 AM 07/14/2024    9:23 AM  CMP  Glucose 70 - 99 mg/dL 877  893  881   BUN 6 - 20 mg/dL 6  6  8    Creatinine 0.61 - 1.24 mg/dL 9.29  9.23  9.16   Sodium 135 - 145 mmol/L 138  137  140   Potassium 3.5 - 5.1 mmol/L 3.9  3.6  3.4   Chloride 98 - 111 mmol/L 104  107  106   CO2 22 - 32 mmol/L 25  25  23    Calcium 8.9 - 10.3 mg/dL 9.0  8.5  9.3   Total Protein 6.5 - 8.1 g/dL 6.7   7.3   Total Bilirubin 0.0 - 1.2 mg/dL 1.1   0.6   Alkaline Phos 38 - 126 U/L 65   76   AST 15 - 41 U/L 40   20   ALT 0 - 44 U/L 26   16     Imaging studies:  CLINICAL DATA:  Acute nonlocalized abdominal pain   EXAM: 07/14/24 CT ABDOMEN AND PELVIS WITH CONTRAST   TECHNIQUE: Multidetector CT imaging of the abdomen and pelvis was performed using the standard protocol following bolus administration of intravenous contrast.   RADIATION DOSE REDUCTION: This exam was performed according to the departmental  dose-optimization program which includes automated exposure control, adjustment of the mA and/or kV according to patient size and/or use of iterative reconstruction technique.   CONTRAST:  OMNIPAQUE  IOHEXOL  300 MG/ML  SOLN   COMPARISON:  09/16/2017   FINDINGS: Lower chest: No acute abnormality.  Small hiatal hernia   Hepatobiliary: Mild hepatic steatosis. No enhancing intrahepatic mass. The extrahepatic bile duct is mildly dilated measuring up to 10 mm in diameter and there is a a subtle filling defect within the distal duct at the level of the into a, best seen on image # 38/2 and 44/5, suspicious for intraductal debris or calculi. No intrahepatic biliary ductal dilation. Gallbladder unremarkable.   Pancreas: Unremarkable   Spleen: Unremarkable   Adrenals/Urinary Tract: Adrenal glands are unremarkable. Kidneys are normal, without renal calculi, focal lesion, or hydronephrosis. Bladder is unremarkable.   Stomach/Bowel: Stomach is within normal limits. Appendix appears normal. No evidence of bowel wall thickening, distention, or inflammatory changes. No free intraperitoneal gas or fluid   Vascular/Lymphatic: Aortic atherosclerosis. No enlarged abdominal or pelvic lymph nodes.   Reproductive: Prostate is unremarkable.   Other: None significant   Musculoskeletal: No acute or significant osseous findings.   IMPRESSION: 1. Mild dilation of the extrahepatic bile duct measuring up to 10 mm in diameter with a subtle filling defect within the distal duct at the level of the ampulla, suspicious for intraductal debris or calculi. Correlate with liver enzymes and consider further evaluation with MRCP. 2. Mild hepatic steatosis. 3. Small hiatal hernia.     Electronically Signed   By: Dorethia Molt M.D.   On: 07/14/2024 11:57  CLINICAL DATA:  Right upper quadrant abdominal pain nausea  and vomiting   EXAM: 07/14/24 MRI ABDOMEN WITHOUT AND WITH CONTRAST (INCLUDING  MRCP)   TECHNIQUE: Multiplanar multisequence MR imaging of the abdomen was performed both before and after the administration of intravenous contrast. Heavily T2-weighted images of the biliary and pancreatic ducts were obtained, and three-dimensional MRCP images were rendered by post processing.   CONTRAST:  7mL GADAVIST  GADOBUTROL  1 MMOL/ML IV SOLN   COMPARISON:  CT scan 07/14/2024   FINDINGS: Lower chest: Unremarkable   Hepatobiliary: Mild hepatic steatosis. No gallbladder wall thickening.   Common bile duct mildly dilated at 0.8 cm in diameter with a truncated distal margin and meniscus appearance as on image 74 series 14 favoring distal choledocholithiasis. No significant wall enhancement in the distal CBD. No abnormal enhancing lesion in the pancreatic head along the distal common bile duct. No significant intrahepatic biliary dilatation.   The gallbladder appears unremarkable.  No pericholecystic fluid.   Pancreas:  Partial pancreas divisum.   Spleen:  Unremarkable   Adrenals/Urinary Tract:  Unremarkable   Stomach/Bowel: Transverse duodenal diverticulum.   Vascular/Lymphatic:  Abdominal aortic atherosclerosis.   Other:  No supplemental non-categorized findings.   Musculoskeletal: Unremarkable   IMPRESSION: 1. Mild dilatation of the common bile duct at 0.8 cm in diameter with a truncated distal margin and meniscus appearance favoring distal choledocholithiasis. No significant wall enhancement in the distal CBD. No abnormal enhancing lesion in the pancreatic head along the distal common bile duct. 2. Mild hepatic steatosis. 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 5.  Aortic Atherosclerosis (ICD10-I70.0).     Electronically Signed   By: Ryan Salvage M.D.   On: 07/14/2024 14:32   Assessment/Plan: 53 y.o. male with ileus vs small bowel obstruction post-ERCP for choledocholithiasis, complicated by pertinent comorbidities  including HIV.   - Stable vital signs  - Awaiting on abdominal x-ray report from this morning. Imaging shows dilatation of bowel  - Manage conservatively for now with NGT, NPO and IV fluids. Plan for small bowel protocol and will follow progress with x-rays   - Discussed the recommendation of cholecystectomy to prevent future recurrence of choledocholithiasis or other complications.  Discussed robotic-assisted laparoscopic cholecystectomy.  Will discuss further once patient improves clinically and is less distended  - Pain management and DVT prophylaxis   Thank you for the opportunity to participate in this patient's care.   -- Gilmer Cea PA-C

## 2024-07-16 NOTE — Plan of Care (Signed)
  Problem: Nutrition: Goal: Adequate nutrition will be maintained Outcome: Not Progressing   Problem: Elimination: Goal: Will not experience complications related to bowel motility Outcome: Not Progressing   

## 2024-07-16 NOTE — Progress Notes (Signed)
 Attempted NGT twice. Both attempts were unsuccessful. Per Dr. Tye, hold off contrast/NGT for now, continue to monitor overnight.   Hunter JONETTA Hope, RN

## 2024-07-16 NOTE — Progress Notes (Addendum)
 PROGRESS NOTE    Timothy Salas.  FMW:[id number] DOB: [birth year] DOA: 07/14/2024 PCP: Osa Geralds, NP   Assessment & Plan:   Principal Problem:   Common bile duct (CBD) obstruction Active Problems:   HIV DISEASE   TOBACCO USER   Abdominal pain  Assessment and Plan:  Common bile duct obstruction: s/p ERCP in which biliary sphincterotomy was performed & the biliary tree was swept and sludge was found. GI following and recs apprec. Oxy, dilaudid  prn for pain. Will likely need cholecystectomy but ileus will need to resolve first. Gen surg following and recs apprec   Likely ileus: NPO. NG tube w/ low intermittent suction to be placed. Started on IVFs. Gen surg following and recs apprec  Smoker: smokes 4-5 cigarettes a daily. Received smoking cessation counseling already    HIV: continue on home dose of biktarvy       DVT prophylaxis: SCDs Code Status: full Family Communication: Disposition Plan: likely d/c back home.  Level of care: Telemetry Medical  Status is: Inpatient Remains inpatient appropriate because: severity of illness    Consultants:  GI Gen surg   Procedures:   Antimicrobials:    Subjective: Pt c/o abd pain, nausea & vomiting  Objective: Vitals:   07/15/24 1702 07/15/24 2002 07/16/24 0337 07/16/24 0839  BP: 124/61 139/86 (!) 140/93 (!) 145/76  Pulse: 71 68 81 66  Resp: 18 16 16 18   Temp: 98.4 F (36.9 C) 98.1 F (36.7 C) 98.1 F (36.7 C) 97.8 F (36.6 C)  TempSrc: Oral Oral Oral Oral  SpO2: 90% 94% 93% 95%  Weight:      Height:        Intake/Output Summary (Last 24 hours) at 07/16/2024 1008 Last data filed at 07/15/2024 2005 Gross per 24 hour  Intake 240 ml  Output --  Net 240 ml   Filed Weights   07/14/24 0921 07/15/24 0307 07/15/24 1347  Weight: 77.1 kg 76.4 kg 76.4 kg    Examination:  General exam appears uncomfortable  Respiratory system: clear breath sounds b/l  Cardiovascular system: S1/S2+. No rubs or  gallops  Gastrointestinal system: abd is firm, tender to palpation, distended & no bowel sounds Central nervous system: alert & oriented. Moves all extremities  Psychiatry: Judgement and insight appears normal. Flat mood and affect    Data Reviewed: I have personally reviewed following labs and imaging studies  CBC: Recent Labs  Lab 07/14/24 0923 07/15/24 0336 07/16/24 0346  WBC 6.6 5.3 14.2*  HGB 15.9 14.2 14.9  HCT 47.4 42.0 43.1  MCV 96.0 97.4 95.8  PLT 228 198 218   Basic Metabolic Panel: Recent Labs  Lab 07/14/24 0923 07/14/24 1621 07/15/24 0336 07/16/24 0346  NA 140  --  137 138  K 3.4*  --  3.6 3.9  CL 106  --  107 104  CO2 23  --  25 25  GLUCOSE 118*  --  106* 122*  BUN 8  --  6 6  CREATININE 0.83  --  0.76 0.70  CALCIUM 9.3  --  8.5* 9.0  MG  --  1.7  --   --    GFR: Estimated Creatinine Clearance: 100.8 mL/min (by C-G formula based on SCr of 0.7 mg/dL). Liver Function Tests: Recent Labs  Lab 07/14/24 0923 07/16/24 0346  AST 20 40  ALT 16 26  ALKPHOS 76 65  BILITOT 0.6 1.1  PROT 7.3 6.7  ALBUMIN 3.7 3.3*   Recent Labs  Lab 07/14/24 [phone]  LIPASE 55*   No results for input(s): AMMONIA in the last 168 hours. Coagulation Profile: No results for input(s): INR, PROTIME in the last 168 hours. Cardiac Enzymes: No results for input(s): CKTOTAL, CKMB, CKMBINDEX, TROPONINI in the last 168 hours. BNP (last 3 results) No results for input(s): PROBNP in the last 8760 hours. HbA1C: No results for input(s): HGBA1C in the last 72 hours. CBG: No results for input(s): GLUCAP in the last 168 hours. Lipid Profile: No results for input(s): CHOL, HDL, LDLCALC, TRIG, CHOLHDL, LDLDIRECT in the last 72 hours. Thyroid Function Tests: No results for input(s): TSH, T4TOTAL, FREET4, T3FREE, THYROIDAB in the last 72 hours. Anemia Panel: No results for input(s): VITAMINB12, FOLATE, FERRITIN, TIBC, IRON, RETICCTPCT  in the last 72 hours. Sepsis Labs: No results for input(s): PROCALCITON, LATICACIDVEN in the last 168 hours.  No results found for this or any previous visit (from the past 240 hours).       Radiology Studies: DG C-Arm 1-60 Min-No Report Result Date: 07/15/2024 Fluoroscopy was utilized by the requesting physician.  No radiographic interpretation.   MR ABDOMEN MRCP W WO CONTAST Result Date: 07/14/2024 CLINICAL DATA:  Right upper quadrant abdominal pain nausea and vomiting EXAM: MRI ABDOMEN WITHOUT AND WITH CONTRAST (INCLUDING MRCP) TECHNIQUE: Multiplanar multisequence MR imaging of the abdomen was performed both before and after the administration of intravenous contrast. Heavily T2-weighted images of the biliary and pancreatic ducts were obtained, and three-dimensional MRCP images were rendered by post processing. CONTRAST:  7mL GADAVIST  GADOBUTROL  1 MMOL/ML IV SOLN COMPARISON:  CT scan 07/14/2024 FINDINGS: Lower chest: Unremarkable Hepatobiliary: Mild hepatic steatosis. No gallbladder wall thickening. Common bile duct mildly dilated at 0.8 cm in diameter with a truncated distal margin and meniscus appearance as on image 74 series 14 favoring distal choledocholithiasis. No significant wall enhancement in the distal CBD. No abnormal enhancing lesion in the pancreatic head along the distal common bile duct. No significant intrahepatic biliary dilatation. The gallbladder appears unremarkable.  No pericholecystic fluid. Pancreas:  Partial pancreas divisum. Spleen:  Unremarkable Adrenals/Urinary Tract:  Unremarkable Stomach/Bowel: Transverse duodenal diverticulum. Vascular/Lymphatic:  Abdominal aortic atherosclerosis. Other:  No supplemental non-categorized findings. Musculoskeletal: Unremarkable IMPRESSION: 1. Mild dilatation of the common bile duct at 0.8 cm in diameter with a truncated distal margin and meniscus appearance favoring distal choledocholithiasis. No significant wall enhancement in  the distal CBD. No abnormal enhancing lesion in the pancreatic head along the distal common bile duct. 2. Mild hepatic steatosis. 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 5.  Aortic Atherosclerosis (ICD10-I70.0). Electronically Signed   By: Ryan Salvage M.D.   On: 07/14/2024 14:32   MR 3D Recon At Scanner Result Date: 07/14/2024 CLINICAL DATA:  Right upper quadrant abdominal pain nausea and vomiting EXAM: MRI ABDOMEN WITHOUT AND WITH CONTRAST (INCLUDING MRCP) TECHNIQUE: Multiplanar multisequence MR imaging of the abdomen was performed both before and after the administration of intravenous contrast. Heavily T2-weighted images of the biliary and pancreatic ducts were obtained, and three-dimensional MRCP images were rendered by post processing. CONTRAST:  7mL GADAVIST  GADOBUTROL  1 MMOL/ML IV SOLN COMPARISON:  CT scan 07/14/2024 FINDINGS: Lower chest: Unremarkable Hepatobiliary: Mild hepatic steatosis. No gallbladder wall thickening. Common bile duct mildly dilated at 0.8 cm in diameter with a truncated distal margin and meniscus appearance as on image 74 series 14 favoring distal choledocholithiasis. No significant wall enhancement in the distal CBD. No abnormal enhancing lesion in the pancreatic head along the distal common bile duct. No significant intrahepatic biliary dilatation.  The gallbladder appears unremarkable.  No pericholecystic fluid. Pancreas:  Partial pancreas divisum. Spleen:  Unremarkable Adrenals/Urinary Tract:  Unremarkable Stomach/Bowel: Transverse duodenal diverticulum. Vascular/Lymphatic:  Abdominal aortic atherosclerosis. Other:  No supplemental non-categorized findings. Musculoskeletal: Unremarkable IMPRESSION: 1. Mild dilatation of the common bile duct at 0.8 cm in diameter with a truncated distal margin and meniscus appearance favoring distal choledocholithiasis. No significant wall enhancement in the distal CBD. No abnormal enhancing lesion in the  pancreatic head along the distal common bile duct. 2. Mild hepatic steatosis. 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 5.  Aortic Atherosclerosis (ICD10-I70.0). Electronically Signed   By: Ryan Salvage M.D.   On: 07/14/2024 14:32   CT ABDOMEN PELVIS W CONTRAST Result Date: 07/14/2024 CLINICAL DATA:  Acute nonlocalized abdominal pain EXAM: CT ABDOMEN AND PELVIS WITH CONTRAST TECHNIQUE: Multidetector CT imaging of the abdomen and pelvis was performed using the standard protocol following bolus administration of intravenous contrast. RADIATION DOSE REDUCTION: This exam was performed according to the departmental dose-optimization program which includes automated exposure control, adjustment of the mA and/or kV according to patient size and/or use of iterative reconstruction technique. CONTRAST:  OMNIPAQUE  IOHEXOL  300 MG/ML  SOLN COMPARISON:  09/16/2017 FINDINGS: Lower chest: No acute abnormality.  Small hiatal hernia Hepatobiliary: Mild hepatic steatosis. No enhancing intrahepatic mass. The extrahepatic bile duct is mildly dilated measuring up to 10 mm in diameter and there is a a subtle filling defect within the distal duct at the level of the into a, best seen on image # 38/2 and 44/5, suspicious for intraductal debris or calculi. No intrahepatic biliary ductal dilation. Gallbladder unremarkable. Pancreas: Unremarkable Spleen: Unremarkable Adrenals/Urinary Tract: Adrenal glands are unremarkable. Kidneys are normal, without renal calculi, focal lesion, or hydronephrosis. Bladder is unremarkable. Stomach/Bowel: Stomach is within normal limits. Appendix appears normal. No evidence of bowel wall thickening, distention, or inflammatory changes. No free intraperitoneal gas or fluid Vascular/Lymphatic: Aortic atherosclerosis. No enlarged abdominal or pelvic lymph nodes. Reproductive: Prostate is unremarkable. Other: None significant Musculoskeletal: No acute or significant osseous  findings. IMPRESSION: 1. Mild dilation of the extrahepatic bile duct measuring up to 10 mm in diameter with a subtle filling defect within the distal duct at the level of the ampulla, suspicious for intraductal debris or calculi. Correlate with liver enzymes and consider further evaluation with MRCP. 2. Mild hepatic steatosis. 3. Small hiatal hernia. Electronically Signed   By: Dorethia Molt M.D.   On: 07/14/2024 11:57        Scheduled Meds:  bictegravir-emtricitabine -tenofovir  AF  1 tablet Oral Daily   pantoprazole   40 mg Oral Daily   Continuous Infusions:   LOS: 2 days       Anthony CHRISTELLA Pouch, MD [facility] Pager [phone]  If 7PM-7AM, please contact night-coverage [email or web address] 07/16/2024, 10:08 AM

## 2024-07-17 DIAGNOSIS — R17 Unspecified jaundice: Secondary | ICD-10-CM

## 2024-07-17 LAB — CBC
HCT: 45.8 % (ref 39.0–52.0)
Hemoglobin: 15.8 g/dL (ref 13.0–17.0)
MCH: 32.8 pg (ref 26.0–34.0)
MCHC: 34.5 g/dL (ref 30.0–36.0)
MCV: 95.2 fL (ref 80.0–100.0)
Platelets: 200 K/uL (ref 150–400)
RBC: 4.81 MIL/uL (ref 4.22–5.81)
RDW: 13.4 % (ref 11.5–15.5)
WBC: 9.9 K/uL (ref 4.0–10.5)
nRBC: 0 % (ref 0.0–0.2)

## 2024-07-17 LAB — COMPREHENSIVE METABOLIC PANEL WITH GFR
ALT: 16 U/L (ref 0–44)
AST: 18 U/L (ref 15–41)
Albumin: 3.1 g/dL — ABNORMAL LOW (ref 3.5–5.0)
Alkaline Phosphatase: 46 U/L (ref 38–126)
Anion gap: 11 (ref 5–15)
BUN: 10 mg/dL (ref 6–20)
CO2: 24 mmol/L (ref 22–32)
Calcium: 8.7 mg/dL — ABNORMAL LOW (ref 8.9–10.3)
Chloride: 102 mmol/L (ref 98–111)
Creatinine, Ser: 0.68 mg/dL (ref 0.61–1.24)
GFR, Estimated: >60 mL/min (ref >60)
Glucose, Bld: 116 mg/dL — ABNORMAL HIGH (ref 70–99)
Potassium: 3.8 mmol/L (ref 3.5–5.1)
Sodium: 137 mmol/L (ref 135–145)
Total Bilirubin: 1.6 mg/dL — ABNORMAL HIGH (ref 0.0–1.2)
Total Protein: 6.6 g/dL (ref 6.5–8.1)

## 2024-07-17 MED ORDER — SIMETHICONE 80 MG PO CHEW
80.0000 mg | CHEWABLE_TABLET | Freq: Four times a day (QID) | ORAL | Status: DC
  Administered 2024-07-17 – 2024-07-20 (×13): 80 mg via ORAL
  Filled 2024-07-17 (×13): qty 1

## 2024-07-17 NOTE — Progress Notes (Signed)
 PROGRESS NOTE    Timothy Salas.  FMW:[id number] DOB: [birth year] DOA: 07/14/2024 PCP: Osa Geralds, NP   Assessment & Plan:   Principal Problem:   Common bile duct (CBD) obstruction Active Problems:   HIV DISEASE   TOBACCO USER   Abdominal pain   Ileus (HCC)  Assessment and Plan:  Common bile duct obstruction: s/p ERCP in which biliary sphincterotomy was performed & the biliary tree was swept and sludge was found. GI following and recs apprec. Oxy, dilaudid  prn for pain. Will likely need cholecystectomy but ileus will need to resolve first. Gen surg following and recs apprec   Likely ileus: started on CLD as per GI. Unable to place NG tube yesterday. No gas or BM yet as per pt. Gen surg following and recs apprec  Smoker: smokes 4-5 cigarettes a daily. Received smoking cessation counseling already    HIV: continue on home dose of biktarvy       DVT prophylaxis: SCDs Code Status: full Family Communication: Disposition Plan: likely d/c back home.  Level of care: Telemetry Medical  Status is: Inpatient Remains inpatient appropriate because: severity of illness    Consultants:  GI Gen surg   Procedures:   Antimicrobials:    Subjective: Pt c/o abd pain & nausesa  Objective: Vitals:   07/16/24 1453 07/16/24 1954 07/17/24 0348 07/17/24 0758  BP: (!) 152/81 (!) 151/89 (!) 162/85 (!) 152/83  Pulse: 90 93 96 90  Resp: 18 16 16 19   Temp: 98.7 F (37.1 C) 98.6 F (37 C) 97.6 F (36.4 C) 99.2 F (37.3 C)  TempSrc:  Oral Oral   SpO2: 90% 90% 93% 92%  Weight:      Height:        Intake/Output Summary (Last 24 hours) at 07/17/2024 1000 Last data filed at 07/17/2024 0948 Gross per 24 hour  Intake 1527.46 ml  Output --  Net 1527.46 ml   Filed Weights   07/14/24 0921 07/15/24 0307 07/15/24 1347  Weight: 77.1 kg 76.4 kg 76.4 kg    Examination:  General exam appears uncomfortable   Respiratory system: clear breath sounds b/l  Cardiovascular  system: S1 & S2+. No rubs or clicks  Gastrointestinal system: abd is less firm, tenderness to palpation, no bowel sounds, distended Central nervous system: alert & oriented. Moves all extremities  Psychiatry: Judgement and insight appears normal. Flat mood and affect     Data Reviewed: I have personally reviewed following labs and imaging studies  CBC: Recent Labs  Lab 07/14/24 0923 07/15/24 0336 07/16/24 0346 07/17/24 0329  WBC 6.6 5.3 14.2* 9.9  HGB 15.9 14.2 14.9 15.8  HCT 47.4 42.0 43.1 45.8  MCV 96.0 97.4 95.8 95.2  PLT 228 198 218 200   Basic Metabolic Panel: Recent Labs  Lab 07/14/24 0923 07/14/24 1621 07/15/24 0336 07/16/24 0346 07/17/24 0329  NA 140  --  137 138 137  K 3.4*  --  3.6 3.9 3.8  CL 106  --  107 104 102  CO2 23  --  25 25 24   GLUCOSE 118*  --  106* 122* 116*  BUN 8  --  6 6 10   CREATININE 0.83  --  0.76 0.70 0.68  CALCIUM 9.3  --  8.5* 9.0 8.7*  MG  --  1.7  --   --   --    GFR: Estimated Creatinine Clearance: 100.8 mL/min (by C-G formula based on SCr of 0.68 mg/dL). Liver Function Tests: Recent Labs  Lab 07/14/24  9076 07/16/24 0346 07/17/24 0329  AST 20 40 18  ALT 16 26 16   ALKPHOS 76 65 46  BILITOT 0.6 1.1 1.6*  PROT 7.3 6.7 6.6  ALBUMIN 3.7 3.3* 3.1*   Recent Labs  Lab 07/14/24 0923  LIPASE 55*   No results for input(s): AMMONIA in the last 168 hours. Coagulation Profile: No results for input(s): INR, PROTIME in the last 168 hours. Cardiac Enzymes: No results for input(s): CKTOTAL, CKMB, CKMBINDEX, TROPONINI in the last 168 hours. BNP (last 3 results) No results for input(s): PROBNP in the last 8760 hours. HbA1C: No results for input(s): HGBA1C in the last 72 hours. CBG: No results for input(s): GLUCAP in the last 168 hours. Lipid Profile: No results for input(s): CHOL, HDL, LDLCALC, TRIG, CHOLHDL, LDLDIRECT in the last 72 hours. Thyroid Function Tests: No results for input(s): TSH,  T4TOTAL, FREET4, T3FREE, THYROIDAB in the last 72 hours. Anemia Panel: No results for input(s): VITAMINB12, FOLATE, FERRITIN, TIBC, IRON, RETICCTPCT in the last 72 hours. Sepsis Labs: No results for input(s): PROCALCITON, LATICACIDVEN in the last 168 hours.  No results found for this or any previous visit (from the past 240 hours).       Radiology Studies: DG Abd Portable 1V-Small Bowel Protocol-Position Verification Result Date: 07/16/2024 CLINICAL DATA:  [id number] Encounter for imaging study to confirm nasogastric (NG) tube placement [id number] EXAM: PORTABLE ABDOMEN - 1 VIEW COMPARISON:  None Available. FINDINGS: Esophagogastric tube terminates in the region of the GE junction. The last side hole is in the distal esophagus. 8 cm of advancement recommended. Scattered nondistended segments of small bowel. Gaseous distention of the transverse and sigmoid colon. No pneumoperitoneum. No organomegaly or radiopaque calculi. No acute fracture or destructive lesion. Small bilateral pleural effusions. IMPRESSION: Esophagogastric tube terminates in the region of the GE junction. 8 cm of advancement recommended. Electronically Signed   By: Rogelia Myers M.D.   On: 07/16/2024 15:45   DG Abd 1 View Result Date: 07/16/2024 CLINICAL DATA:  Abdominal distention. EXAM: ABDOMEN - 1 VIEW COMPARISON:  CT chest abdomen pelvis 07/14/2024. FINDINGS: Loops of mildly dilated gas-filled bowel within the mid abdomen measuring up to 3.3 cm in diameter. Dense stool noted over the right lower quadrant within the cecum and ascending colon. There is additional prominent gas-filled transverse colon appreciated. Supine radiograph without definite free air appreciated. Osseous structures are unremarkable. IMPRESSION: Dilated gas-filled small bowel within the mid abdomen concerning for ileus versus developing small bowel obstruction. Recommend correlation with abdominal exam and serial radiographs. Moderate stool  burden in the right lower quadrant. Electronically Signed   By: Donnice Mania M.D.   On: 07/16/2024 12:31   DG C-Arm 1-60 Min-No Report Result Date: 07/15/2024 Fluoroscopy was utilized by the requesting physician.  No radiographic interpretation.        Scheduled Meds:  bictegravir-emtricitabine -tenofovir  AF  1 tablet Oral Daily   diatrizoate  meglumine -sodium  90 mL Per NG tube Once   pantoprazole   40 mg Oral Daily   scopolamine   1 patch Transdermal Q72H   simethicone  80 mg Oral QID   Continuous Infusions:  sodium chloride  75 mL/hr at 07/17/24 0948   promethazine  (PHENERGAN ) injection (IM or IVPB) Stopped (07/16/24 1627)     LOS: 3 days       Anthony CHRISTELLA Pouch, MD [facility] Pager [phone]  If 7PM-7AM, please contact night-coverage [email or web address] 07/17/2024, 10:00 AM

## 2024-07-17 NOTE — Plan of Care (Signed)

## 2024-07-17 NOTE — Progress Notes (Signed)
 [facility]- General Surgery  SURGICAL PROGRESS NOTE  Hospital Day(s): 3.   Post op day(s): 2 Days Post-Op.   Interval History:  After NG tube was placed yesterday, radiologist recommended 8cm of advancement.  When nurse tried to advance tube, patient had an emesis episode and NG tube was removed.  Nurse attempted to put NG tube back but was not successful.  Therefore patient has not received Gastrografin.  Planned to just monitor overnight. This morning patient was seen and examined.  States pain is better controlled with pain medication.  He was giving antiemetic medication for nausea.  No vomiting has been reported.  Still distended.  Denies any bowel movement or flatulence.    Vital signs in last 24 hours: [min-max] current  Temp:  [97.6 F (36.4 C)-99.2 F (37.3 C)] 99.2 F (37.3 C) (07/25 0758) Pulse Rate:  [90-96] 90 (07/25 0758) Resp:  [16-19] 19 (07/25 0758) BP: (151-162)/(81-89) 152/83 (07/25 0758) SpO2:  [90 %-93 %] 92 % (07/25 0758)     Height: 5' 5 (165.1 cm) Weight: 76.4 kg BMI (Calculated): 28.03   Intake/Output last 2 shifts:  07/24 0701 - 07/25 0700 In: 871.4 [I.V.:821.4; IV Piggyback:50] Out: -    Physical Exam:  Constitutional: alert, cooperative and no distress  Respiratory: breathing non-labored at rest  Cardiovascular: regular rate and sinus rhythm  Gastrointestinal: soft, mildly tender to palpation, and still distended   Labs:     Latest Ref Rng & Units 07/17/2024    3:29 AM 07/16/2024    3:46 AM 07/15/2024    3:36 AM  CBC  WBC 4.0 - 10.5 K/uL 9.9  14.2  5.3   Hemoglobin 13.0 - 17.0 g/dL 84.1  85.0  85.7   Hematocrit 39.0 - 52.0 % 45.8  43.1  42.0   Platelets 150 - 400 K/uL 200  218  198       Latest Ref Rng & Units 07/17/2024    3:29 AM 07/16/2024    3:46 AM 07/15/2024    3:36 AM  CMP  Glucose 70 - 99 mg/dL 883  877  893   BUN 6 - 20 mg/dL 10  6  6    Creatinine 0.61 - 1.24 mg/dL 9.31  9.29  9.23   Sodium 135 - 145 mmol/L 137  138  137    Potassium 3.5 - 5.1 mmol/L 3.8  3.9  3.6   Chloride 98 - 111 mmol/L 102  104  107   CO2 22 - 32 mmol/L 24  25  25    Calcium 8.9 - 10.3 mg/dL 8.7  9.0  8.5   Total Protein 6.5 - 8.1 g/dL 6.6  6.7    Total Bilirubin 0.0 - 1.2 mg/dL 1.6  1.1    Alkaline Phos 38 - 126 U/L 46  65    AST 15 - 41 U/L 18  40    ALT 0 - 44 U/L 16  26      Imaging studies:  CLINICAL DATA:  Abdominal distention.   EXAM: ABDOMEN - 1 VIEW   COMPARISON:  CT chest abdomen pelvis 07/14/2024.   FINDINGS: Loops of mildly dilated gas-filled bowel within the mid abdomen measuring up to 3.3 cm in diameter. Dense stool noted over the right lower quadrant within the cecum and ascending colon. There is additional prominent gas-filled transverse colon appreciated. Supine radiograph without definite free air appreciated. Osseous structures are unremarkable.   IMPRESSION: Dilated gas-filled small bowel within the mid abdomen concerning for ileus  versus developing small bowel obstruction. Recommend correlation with abdominal exam and serial radiographs.   Moderate stool burden in the right lower quadrant.     Electronically Signed   By: Donnice Mania M.D.   On: 07/16/2024 12:31  Assessment/Plan:  53 y.o. male with ileus vs small bowel obstruction post-ERCP for choledocholithiasis  complicated by pertinent comorbidities including HIV.   - Stable vital signs    - Leukocytosis resolved  14.2 >> 9.9  - GI managing ileus-started clear liquid diet today. No surgical indication at this point. Will wait until ileus resolves to further discuss and proceed with cholecystectomy. Surgery will still be available for any questions or concerns.   -- Gilmer Cea PA-C

## 2024-07-17 NOTE — Progress Notes (Signed)
 Timothy Copping, MD [facility]   [address] [place], [place] [address] Phone: [phone] Fax : [phone]   Subjective: The patient reports that his abdominal pain is less today than it was yesterday.  The patient had an NG tube placed but it was not in a good position and when they tried to adjust it it was unable to be adjusted correctly therefore it was taken out.  The patient states that he has not passed any gas rectally but has been having burping.  He states that he is very thirsty and would like to start on a clear liquid diet.   Objective: Vital signs in last 24 hours: Vitals:   07/16/24 1453 07/16/24 1954 07/17/24 0348 07/17/24 0758  BP: (!) 152/81 (!) 151/89 (!) 162/85 (!) 152/83  Pulse: 90 93 96 90  Resp: 18 16 16 19   Temp: 98.7 F (37.1 C) 98.6 F (37 C) 97.6 F (36.4 C) 99.2 F (37.3 C)  TempSrc:  Oral Oral   SpO2: 90% 90% 93% 92%  Weight:      Height:       Weight change:   Intake/Output Summary (Last 24 hours) at 07/17/2024 9081 Last data filed at 07/17/2024 0407 Gross per 24 hour  Intake 871.41 ml  Output --  Net 871.41 ml     Exam: Heart:: Regular rate and rhythm or without murmur or extra heart sounds Lungs: normal and clear to auscultation and percussion Abdomen: Soft with decreased bowel sounds and tympanic   Lab Results: @LABTEST2 @ Micro Results: No results found for this or any previous visit (from the past 240 hours). Studies/Results: DG Abd Portable 1V-Small Bowel Protocol-Position Verification Result Date: 07/16/2024 CLINICAL DATA:  [id number] Encounter for imaging study to confirm nasogastric (NG) tube placement [id number] EXAM: PORTABLE ABDOMEN - 1 VIEW COMPARISON:  None Available. FINDINGS: Esophagogastric tube terminates in the region of the GE junction. The last side hole is in the distal esophagus. 8 cm of advancement recommended. Scattered nondistended segments of small bowel. Gaseous distention of the transverse and sigmoid colon.  No pneumoperitoneum. No organomegaly or radiopaque calculi. No acute fracture or destructive lesion. Small bilateral pleural effusions. IMPRESSION: Esophagogastric tube terminates in the region of the GE junction. 8 cm of advancement recommended. Electronically Signed   By: Timothy Salas M.D.   On: 07/16/2024 15:45   DG Abd 1 View Result Date: 07/16/2024 CLINICAL DATA:  Abdominal distention. EXAM: ABDOMEN - 1 VIEW COMPARISON:  CT chest abdomen pelvis 07/14/2024. FINDINGS: Loops of mildly dilated gas-filled bowel within the mid abdomen measuring up to 3.3 cm in diameter. Dense stool noted over the right lower quadrant within the cecum and ascending colon. There is additional prominent gas-filled transverse colon appreciated. Supine radiograph without definite free air appreciated. Osseous structures are unremarkable. IMPRESSION: Dilated gas-filled small bowel within the mid abdomen concerning for ileus versus developing small bowel obstruction. Recommend correlation with abdominal exam and serial radiographs. Moderate stool burden in the right lower quadrant. Electronically Signed   By: Donnice Mania M.D.   On: 07/16/2024 12:31   DG C-Arm 1-60 Min-No Report Result Date: 07/15/2024 Fluoroscopy was utilized by the requesting physician.  No radiographic interpretation.   Medications: I have reviewed the patient's current medications. Scheduled Meds:  bictegravir-emtricitabine -tenofovir  AF  1 tablet Oral Daily   diatrizoate  meglumine -sodium  90 mL Per NG tube Once   pantoprazole   40 mg Oral Daily   scopolamine   1 patch Transdermal Q72H   Continuous Infusions:  sodium chloride  75 mL/hr at 07/17/24 0407   promethazine  (PHENERGAN ) injection (IM or IVPB) Stopped (07/16/24 1627)   PRN Meds:.acetaminophen  **OR** acetaminophen , HYDROmorphone  (DILAUDID ) injection, nicotine , ondansetron  **OR** ondansetron  (ZOFRAN ) IV, oxyCODONE -acetaminophen , promethazine  (PHENERGAN ) injection (IM or IVPB),  senna-docusate   Assessment: Principal Problem:   Common bile duct (CBD) obstruction Active Problems:   HIV DISEASE   TOBACCO USER   Abdominal pain   Ileus (HCC)    Plan: The patient has been doing well overnight with continued abdominal distention status post ERCP.  The patient has had an increase in the bilirubin but this is isolated without any other liver enzymes abnormality and is unlikely due to biliary obstruction with a normal enzymes. As for the continued ileus the patient has been told to move around as much as he can to mobilize some of the air and he will be put on a clear liquid diet.  He has been told to avoid drinking with a straw thereby decreasing the amount of swallowed air.  Dr. Onita will be covering this weekend.   LOS: 3 days   Timothy Copping, MD.FACG 07/17/2024, 9:18 AM Pager [phone] 7am-5pm  Check AMION for 5pm -7am coverage and on weekends

## 2024-07-17 NOTE — Plan of Care (Signed)
   Problem: Education: Goal: Knowledge of General Education information will improve Description: Including pain rating scale, medication(s)/side effects and non-pharmacologic comfort measures Outcome: Progressing   Problem: Clinical Measurements: Goal: Respiratory complications will improve Outcome: Progressing   Problem: Activity: Goal: Risk for activity intolerance will decrease Outcome: Progressing

## 2024-07-18 LAB — COMPREHENSIVE METABOLIC PANEL WITH GFR
ALT: 14 U/L (ref 0–44)
AST: 13 U/L — ABNORMAL LOW (ref 15–41)
Albumin: 2.7 g/dL — ABNORMAL LOW (ref 3.5–5.0)
Alkaline Phosphatase: 37 U/L — ABNORMAL LOW (ref 38–126)
Anion gap: 7 (ref 5–15)
BUN: 15 mg/dL (ref 6–20)
CO2: 21 mmol/L — ABNORMAL LOW (ref 22–32)
Calcium: 8.4 mg/dL — ABNORMAL LOW (ref 8.9–10.3)
Chloride: 103 mmol/L (ref 98–111)
Creatinine, Ser: 0.61 mg/dL (ref 0.61–1.24)
GFR, Estimated: >60 mL/min (ref >60)
Glucose, Bld: 121 mg/dL — ABNORMAL HIGH (ref 70–99)
Potassium: 3.5 mmol/L (ref 3.5–5.1)
Sodium: 131 mmol/L — ABNORMAL LOW (ref 135–145)
Total Bilirubin: 1.3 mg/dL — ABNORMAL HIGH (ref 0.0–1.2)
Total Protein: 6.4 g/dL — ABNORMAL LOW (ref 6.5–8.1)

## 2024-07-18 LAB — CBC
HCT: 41.3 % (ref 39.0–52.0)
Hemoglobin: 14.3 g/dL (ref 13.0–17.0)
MCH: 33.6 pg (ref 26.0–34.0)
MCHC: 34.6 g/dL (ref 30.0–36.0)
MCV: 96.9 fL (ref 80.0–100.0)
Platelets: 165 K/uL (ref 150–400)
RBC: 4.26 MIL/uL (ref 4.22–5.81)
RDW: 13.7 % (ref 11.5–15.5)
WBC: 8.2 K/uL (ref 4.0–10.5)
nRBC: 0 % (ref 0.0–0.2)

## 2024-07-18 MED ORDER — POLYETHYLENE GLYCOL 3350 17 G PO PACK
17.0000 g | PACK | Freq: Two times a day (BID) | ORAL | Status: DC
  Administered 2024-07-18 – 2024-07-19 (×4): 17 g via ORAL
  Filled 2024-07-18 (×5): qty 1

## 2024-07-18 MED ORDER — ENOXAPARIN SODIUM 40 MG/0.4ML IJ SOSY
40.0000 mg | PREFILLED_SYRINGE | INTRAMUSCULAR | Status: DC
  Administered 2024-07-19: 40 mg via SUBCUTANEOUS
  Filled 2024-07-18: qty 0.4

## 2024-07-18 MED ORDER — SENNOSIDES-DOCUSATE SODIUM 8.6-50 MG PO TABS
1.0000 | ORAL_TABLET | Freq: Every day | ORAL | Status: DC
  Administered 2024-07-18 – 2024-07-19 (×2): 1 via ORAL
  Filled 2024-07-18 (×2): qty 1

## 2024-07-18 NOTE — Plan of Care (Signed)

## 2024-07-18 NOTE — Progress Notes (Signed)
 PROGRESS NOTE    Timothy Salas.  FMW:[id number] DOB: 09-Jul-1971 DOA: 07/14/2024 PCP: Osa Geralds, NP   Assessment & Plan:   Principal Problem:   Common bile duct (CBD) obstruction Active Problems:   HIV DISEASE   TOBACCO USER   Abdominal pain   Ileus (HCC)  Assessment and Plan:  Common bile duct obstruction: s/p ERCP in which biliary sphincterotomy was performed & the biliary tree was swept and sludge was found. GI following and recs apprec. Oxy, dilaudid  prn for pain. Will likely need cholecystectomy but ileus will need to resolve first. Gen surg following and recs apprec   Likely ileus: continue on CLD as per GI. No BM yet but gas. Encouraged ambulation. Abd still distended.   Smoker: smokes 4-5 cigarettes a daily. Received smoking cessation counseling already    HIV: continue on home dose of biktarvy       DVT prophylaxis: lovenox  Code Status: full Family Communication: Disposition Plan: likely d/c back home.  Level of care: Telemetry Medical  Status is: Inpatient Remains inpatient appropriate because: severity of illness, still has an ileus    Consultants:  GI Gen surg   Procedures:   Antimicrobials:    Subjective: Pt c/o abd pain   Objective: Vitals:   07/17/24 1501 07/17/24 1919 07/18/24 0345 07/18/24 0744  BP: (!) 140/86 (!) 136/92 134/79 135/83  Pulse: 98 (!) 102 90 94  Resp: 18 16 16  (!) 23  Temp: 98.7 F (37.1 C) 98.7 F (37.1 C) 98.5 F (36.9 C) 98 F (36.7 C)  TempSrc: Oral     SpO2: 92% 93% 92% 93%  Weight:      Height:        Intake/Output Summary (Last 24 hours) at 07/18/2024 0920 Last data filed at 07/17/2024 1700 Gross per 24 hour  Intake 1689.38 ml  Output --  Net 1689.38 ml   Filed Weights   07/14/24 0921 07/15/24 0307 07/15/24 1347  Weight: 77.1 kg 76.4 kg 76.4 kg    Examination:  General exam appears calm but uncomfortable   Respiratory system: clear breath sounds b/l  Cardiovascular system:  S1/S2+. No rubs or clicks  Gastrointestinal system: abd is less firm, distended, tenderness to palpation, hypoactive bowel sounds  Central nervous system: alert & oriented. Moves all extremities  Psychiatry: judgement and insight appears normal. Flat mood and affect    Data Reviewed: I have personally reviewed following labs and imaging studies  CBC: Recent Labs  Lab 07/14/24 0923 07/15/24 0336 07/16/24 0346 07/17/24 0329 07/18/24 0317  WBC 6.6 5.3 14.2* 9.9 8.2  HGB 15.9 14.2 14.9 15.8 14.3  HCT 47.4 42.0 43.1 45.8 41.3  MCV 96.0 97.4 95.8 95.2 96.9  PLT 228 198 218 200 165   Basic Metabolic Panel: Recent Labs  Lab 07/14/24 0923 07/14/24 1621 07/15/24 0336 07/16/24 0346 07/17/24 0329 07/18/24 0317  NA 140  --  137 138 137 131*  K 3.4*  --  3.6 3.9 3.8 3.5  CL 106  --  107 104 102 103  CO2 23  --  25 25 24  21*  GLUCOSE 118*  --  106* 122* 116* 121*  BUN 8  --  6 6 10 15   CREATININE 0.83  --  0.76 0.70 0.68 0.61  CALCIUM 9.3  --  8.5* 9.0 8.7* 8.4*  MG  --  1.7  --   --   --   --    GFR: Estimated Creatinine Clearance: 100.8 mL/min (by C-G formula  based on SCr of 0.61 mg/dL). Liver Function Tests: Recent Labs  Lab 07/14/24 0923 07/16/24 0346 07/17/24 0329 07/18/24 0317  AST 20 40 18 13*  ALT 16 26 16 14   ALKPHOS 76 65 46 37*  BILITOT 0.6 1.1 1.6* 1.3*  PROT 7.3 6.7 6.6 6.4*  ALBUMIN 3.7 3.3* 3.1* 2.7*   Recent Labs  Lab 07/14/24 0923  LIPASE 55*   No results for input(s): AMMONIA in the last 168 hours. Coagulation Profile: No results for input(s): INR, PROTIME in the last 168 hours. Cardiac Enzymes: No results for input(s): CKTOTAL, CKMB, CKMBINDEX, TROPONINI in the last 168 hours. BNP (last 3 results) No results for input(s): PROBNP in the last 8760 hours. HbA1C: No results for input(s): HGBA1C in the last 72 hours. CBG: No results for input(s): GLUCAP in the last 168 hours. Lipid Profile: No results for input(s): CHOL,  HDL, LDLCALC, TRIG, CHOLHDL, LDLDIRECT in the last 72 hours. Thyroid Function Tests: No results for input(s): TSH, T4TOTAL, FREET4, T3FREE, THYROIDAB in the last 72 hours. Anemia Panel: No results for input(s): VITAMINB12, FOLATE, FERRITIN, TIBC, IRON, RETICCTPCT in the last 72 hours. Sepsis Labs: No results for input(s): PROCALCITON, LATICACIDVEN in the last 168 hours.  No results found for this or any previous visit (from the past 240 hours).       Radiology Studies: DG Abd Portable 1V-Small Bowel Protocol-Position Verification Result Date: 07/16/2024 CLINICAL DATA:  [id number] Encounter for imaging study to confirm nasogastric (NG) tube placement [id number] EXAM: PORTABLE ABDOMEN - 1 VIEW COMPARISON:  None Available. FINDINGS: Esophagogastric tube terminates in the region of the GE junction. The last side hole is in the distal esophagus. 8 cm of advancement recommended. Scattered nondistended segments of small bowel. Gaseous distention of the transverse and sigmoid colon. No pneumoperitoneum. No organomegaly or radiopaque calculi. No acute fracture or destructive lesion. Small bilateral pleural effusions. IMPRESSION: Esophagogastric tube terminates in the region of the GE junction. 8 cm of advancement recommended. Electronically Signed   By: Rogelia Myers M.D.   On: 07/16/2024 15:45        Scheduled Meds:  bictegravir-emtricitabine -tenofovir  AF  1 tablet Oral Daily   diatrizoate  meglumine -sodium  90 mL Per NG tube Once   pantoprazole   40 mg Oral Daily   scopolamine   1 patch Transdermal Q72H   simethicone   80 mg Oral QID   Continuous Infusions:  sodium chloride  75 mL/hr at 07/17/24 2014   promethazine  (PHENERGAN ) injection (IM or IVPB) Stopped (07/16/24 1627)     LOS: 4 days       Anthony CHRISTELLA Pouch, MD [facility] Pager [phone]  If 7PM-7AM, please contact night-coverage [email or web address] 07/18/2024, 9:20 AM

## 2024-07-19 ENCOUNTER — Inpatient Hospital Stay: Payer: Self-pay

## 2024-07-19 LAB — COMPREHENSIVE METABOLIC PANEL WITH GFR
ALT: 11 U/L (ref 0–44)
AST: 15 U/L (ref 15–41)
Albumin: 2.4 g/dL — ABNORMAL LOW (ref 3.5–5.0)
Alkaline Phosphatase: 39 U/L (ref 38–126)
Anion gap: 9 (ref 5–15)
BUN: 11 mg/dL (ref 6–20)
CO2: 21 mmol/L — ABNORMAL LOW (ref 22–32)
Calcium: 8.4 mg/dL — ABNORMAL LOW (ref 8.9–10.3)
Chloride: 102 mmol/L (ref 98–111)
Creatinine, Ser: 0.73 mg/dL (ref 0.61–1.24)
GFR, Estimated: >60 mL/min (ref >60)
Glucose, Bld: 126 mg/dL — ABNORMAL HIGH (ref 70–99)
Potassium: 3.3 mmol/L — ABNORMAL LOW (ref 3.5–5.1)
Sodium: 132 mmol/L — ABNORMAL LOW (ref 135–145)
Total Bilirubin: 1 mg/dL (ref 0.0–1.2)
Total Protein: 5.7 g/dL — ABNORMAL LOW (ref 6.5–8.1)

## 2024-07-19 LAB — CBC
HCT: 38.5 % — ABNORMAL LOW (ref 39.0–52.0)
Hemoglobin: 13.1 g/dL (ref 13.0–17.0)
MCH: 32.5 pg (ref 26.0–34.0)
MCHC: 34 g/dL (ref 30.0–36.0)
MCV: 95.5 fL (ref 80.0–100.0)
Platelets: 174 K/uL (ref 150–400)
RBC: 4.03 MIL/uL — ABNORMAL LOW (ref 4.22–5.81)
RDW: 13.3 % (ref 11.5–15.5)
WBC: 7.7 K/uL (ref 4.0–10.5)
nRBC: 0 % (ref 0.0–0.2)

## 2024-07-19 MED ORDER — POTASSIUM CHLORIDE CRYS ER 20 MEQ PO TBCR
40.0000 meq | EXTENDED_RELEASE_TABLET | Freq: Once | ORAL | Status: AC
  Administered 2024-07-19: 40 meq via ORAL
  Filled 2024-07-19: qty 2

## 2024-07-19 NOTE — Progress Notes (Signed)
 PROGRESS NOTE    Timothy Salas.  FMW:[id number] DOB: 07-23-1971 DOA: 07/14/2024 PCP: Osa Geralds, NP   Assessment & Plan:   Principal Problem:   Common bile duct (CBD) obstruction Active Problems:   HIV DISEASE   TOBACCO USER   Abdominal pain   Ileus (HCC)  Assessment and Plan:  Common bile duct obstruction: s/p ERCP in which biliary sphincterotomy was performed & the biliary tree was swept and sludge was found. GI following and recs apprec. Oxy, dilaudid  prn for pain. Will likely need cholecystectomy but ileus will need to resolve first. Gen surg following and recs apprec   Likely ileus: had 2 liquid BMs today so far. Improved distention on XR. Diet advanced to full liquid as per GI. Encouraged ambulation. Continue w/ miralax , senokot as per GI   Smoker: smokes 4-5 cigarettes a daily. Received smoking cessation counseling already    HIV: continue on home dose of biktarvy       DVT prophylaxis: lovenox  Code Status: full Family Communication: Disposition Plan: likely d/c back home.  Level of care: Telemetry Medical  Status is: Inpatient Remains inpatient appropriate because: severity of illness, ileus has improved    Consultants:  GI Gen surg   Procedures:   Antimicrobials:    Subjective: Pt c/o malaise   Objective: Vitals:   07/18/24 0345 07/18/24 0744 07/18/24 1613 07/18/24 1937  BP: 134/79 135/83 (!) 143/70 (!) 149/80  Pulse: 90 94 83 93  Resp: 16 (!) 23 19 20   Temp: 98.5 F (36.9 C) 98 F (36.7 C) 98.4 F (36.9 C) 98.3 F (36.8 C)  TempSrc:    Oral  SpO2: 92% 93% 96% 92%  Weight:      Height:        Intake/Output Summary (Last 24 hours) at 07/19/2024 0822 Last data filed at 07/19/2024 0316 Gross per 24 hour  Intake 720 ml  Output 1 ml  Net 719 ml   Filed Weights   07/14/24 0921 07/15/24 0307 07/15/24 1347  Weight: 77.1 kg 76.4 kg 76.4 kg    Examination:  General exam appears comfortable Respiratory system: clear  breath sounds b/l  Cardiovascular system: S1 & S2+. No rubs or clicks Gastrointestinal system: abd is less firm, mild distention, hypoactive bowel sounds Central nervous system: alert & oriented. Moves all extremities  Psychiatry: judgement and insight appears normal. Flat mood and affect    Data Reviewed: I have personally reviewed following labs and imaging studies  CBC: Recent Labs  Lab 07/15/24 0336 07/16/24 0346 07/17/24 0329 07/18/24 0317 07/19/24 0440  WBC 5.3 14.2* 9.9 8.2 7.7  HGB 14.2 14.9 15.8 14.3 13.1  HCT 42.0 43.1 45.8 41.3 38.5*  MCV 97.4 95.8 95.2 96.9 95.5  PLT 198 218 200 165 174   Basic Metabolic Panel: Recent Labs  Lab 07/14/24 1621 07/15/24 0336 07/16/24 0346 07/17/24 0329 07/18/24 0317 07/19/24 0440  NA  --  137 138 137 131* 132*  K  --  3.6 3.9 3.8 3.5 3.3*  CL  --  107 104 102 103 102  CO2  --  25 25 24  21* 21*  GLUCOSE  --  106* 122* 116* 121* 126*  BUN  --  6 6 10 15 11   CREATININE  --  0.76 0.70 0.68 0.61 0.73  CALCIUM  --  8.5* 9.0 8.7* 8.4* 8.4*  MG 1.7  --   --   --   --   --    GFR: Estimated Creatinine Clearance: 100.8  mL/min (by C-G formula based on SCr of 0.73 mg/dL). Liver Function Tests: Recent Labs  Lab 07/14/24 0923 07/16/24 0346 07/17/24 0329 07/18/24 0317 07/19/24 0440  AST 20 40 18 13* 15  ALT 16 26 16 14 11   ALKPHOS 76 65 46 37* 39  BILITOT 0.6 1.1 1.6* 1.3* 1.0  PROT 7.3 6.7 6.6 6.4* 5.7*  ALBUMIN 3.7 3.3* 3.1* 2.7* 2.4*   Recent Labs  Lab 07/14/24 0923  LIPASE 55*   No results for input(s): AMMONIA in the last 168 hours. Coagulation Profile: No results for input(s): INR, PROTIME in the last 168 hours. Cardiac Enzymes: No results for input(s): CKTOTAL, CKMB, CKMBINDEX, TROPONINI in the last 168 hours. BNP (last 3 results) No results for input(s): PROBNP in the last 8760 hours. HbA1C: No results for input(s): HGBA1C in the last 72 hours. CBG: No results for input(s): GLUCAP in the  last 168 hours. Lipid Profile: No results for input(s): CHOL, HDL, LDLCALC, TRIG, CHOLHDL, LDLDIRECT in the last 72 hours. Thyroid Function Tests: No results for input(s): TSH, T4TOTAL, FREET4, T3FREE, THYROIDAB in the last 72 hours. Anemia Panel: No results for input(s): VITAMINB12, FOLATE, FERRITIN, TIBC, IRON, RETICCTPCT in the last 72 hours. Sepsis Labs: No results for input(s): PROCALCITON, LATICACIDVEN in the last 168 hours.  No results found for this or any previous visit (from the past 240 hours).       Radiology Studies: No results found.       Scheduled Meds:  bictegravir-emtricitabine -tenofovir  AF  1 tablet Oral Daily   diatrizoate  meglumine -sodium  90 mL Per NG tube Once   enoxaparin  (LOVENOX ) injection  40 mg Subcutaneous Q24H   pantoprazole   40 mg Oral Daily   polyethylene glycol  17 g Oral BID   potassium chloride   40 mEq Oral Once   scopolamine   1 patch Transdermal Q72H   senna-docusate  1 tablet Oral QHS   simethicone   80 mg Oral QID   Continuous Infusions:  sodium chloride  75 mL/hr at 07/18/24 2035   promethazine  (PHENERGAN ) injection (IM or IVPB) Stopped (07/16/24 1627)     LOS: 5 days       Anthony CHRISTELLA Pouch, MD [facility] Pager [phone]  If 7PM-7AM, please contact night-coverage [email or web address] 07/19/2024, 8:22 AM

## 2024-07-19 NOTE — Progress Notes (Signed)
 Inpatient Follow-up/Progress Note   Patient ID: Timothy Salas. is a 53 y.o. male.  Overnight Events / Subjective Findings Pt had 2 Bms o/n. No melena or hematochezia.  He feels that his abdominal pain is improved.  Still some distention, but again better with the previous bowel movements.  He is tolerating clear liquids and hoping to advance his diet.  No nausea or vomiting.  Review of Systems  Constitutional:  Negative for activity change, appetite change, chills, diaphoresis, fatigue, fever and unexpected weight change.  HENT:  Negative for trouble swallowing and voice change.   Respiratory:  Negative for shortness of breath and wheezing.   Cardiovascular:  Negative for chest pain, palpitations and leg swelling.  Gastrointestinal:  Positive for abdominal distention and constipation. Negative for abdominal pain, anal bleeding, blood in stool, diarrhea, nausea and vomiting.  Musculoskeletal:  Negative for arthralgias and myalgias.  Skin:  Negative for color change and pallor.  Neurological:  Negative for dizziness, syncope and weakness.  Psychiatric/Behavioral:  Negative for confusion. The patient is not nervous/anxious.   All other systems reviewed and are negative.    Medications  Current Facility-Administered Medications:    0.9 %  sodium chloride  infusion, , Intravenous, Continuous, Trudy Anthony HERO, MD, Last Rate: 75 mL/hr at 07/18/24 2035, New Bag at 07/18/24 2035   acetaminophen  (TYLENOL ) tablet 650 mg, 650 mg, Oral, Q6H PRN, 650 mg at 07/19/24 0713 **OR** acetaminophen  (TYLENOL ) suppository 650 mg, 650 mg, Rectal, Q6H PRN, Jinny Carmine, MD   bictegravir-emtricitabine -tenofovir  AF (BIKTARVY ) 50-200-25 MG per tablet 1 tablet, 1 tablet, Oral, Daily, Jinny Carmine, MD, 1 tablet at 07/18/24 9140   diatrizoate  meglumine -sodium (GASTROGRAFIN) 66-10 % solution 90 mL, 90 mL, Per NG tube, Once, [employer], Adyazbeth, PA-C   enoxaparin  (LOVENOX ) injection 40 mg, 40 mg,  Subcutaneous, Q24H, Trudy, Jamiese M, MD   HYDROmorphone  (DILAUDID ) injection 1 mg, 1 mg, Intravenous, Q3H PRN, Trudy Anthony HERO, MD, 1 mg at 07/18/24 2034   nicotine  (NICODERM CQ  - dosed in mg/24 hours) patch 21 mg, 21 mg, Transdermal, Daily PRN, Jinny Carmine, MD   ondansetron  (ZOFRAN ) tablet 4 mg, 4 mg, Oral, Q6H PRN, 4 mg at 07/16/24 0930 **OR** ondansetron  (ZOFRAN ) injection 4 mg, 4 mg, Intravenous, Q6H PRN, Jinny Carmine, MD, 4 mg at 07/17/24 9587   oxyCODONE -acetaminophen  (PERCOCET/ROXICET) 5-325 MG per tablet 1 tablet, 1 tablet, Oral, Q6H PRN, Trudy Anthony HERO, MD, 1 tablet at 07/16/24 0931   pantoprazole  (PROTONIX ) EC tablet 40 mg, 40 mg, Oral, Daily, Jinny, Darren, MD, 40 mg at 07/18/24 0859   polyethylene glycol (MIRALAX  / GLYCOLAX ) packet 17 g, 17 g, Oral, BID, Trudy Anthony HERO, MD, 17 g at 07/18/24 2033   potassium chloride  SA (KLOR-CON  M) CR tablet 40 mEq, 40 mEq, Oral, Once, Trudy Anthony HERO, MD   promethazine  (PHENERGAN ) 12.5 mg in sodium chloride  0.9 % 50 mL IVPB, 12.5 mg, Intravenous, Q6H PRN, Trudy Anthony HERO, MD, Stopped at 07/16/24 1627   scopolamine  (TRANSDERM-SCOP) 1 MG/3DAYS 1.5 mg, 1 patch, Transdermal, Q72H, Trudy Anthony HERO, MD, 1.5 mg at 07/16/24 1608   senna-docusate (Senokot-S) tablet 1 tablet, 1 tablet, Oral, QHS, Trudy Anthony HERO, MD, 1 tablet at 07/18/24 2237   simethicone  (MYLICON) chewable tablet 80 mg, 80 mg, Oral, QID, Barrientos Solis, [place], PA-C, 80 mg at 07/18/24 2237  sodium chloride  75 mL/hr at 07/18/24 2035   promethazine  (PHENERGAN ) injection (IM or IVPB) Stopped (07/16/24 1627)    acetaminophen  **OR** acetaminophen , HYDROmorphone  (DILAUDID ) injection, nicotine ,  ondansetron  **OR** ondansetron  (ZOFRAN ) IV, oxyCODONE -acetaminophen , promethazine  (PHENERGAN ) injection (IM or IVPB)   Objective    Vitals:   07/18/24 0345 07/18/24 0744 07/18/24 1613 07/18/24 1937  BP: 134/79 135/83 (!) 143/70 (!) 149/80  Pulse: 90 94 83 93  Resp:  16 (!) 23 19 20   Temp: 98.5 F (36.9 C) 98 F (36.7 C) 98.4 F (36.9 C) 98.3 F (36.8 C)  TempSrc:    Oral  SpO2: 92% 93% 96% 92%  Weight:      Height:         Physical Exam Vitals and nursing note reviewed.  Constitutional:      General: He is not in acute distress.    Appearance: He is not ill-appearing, toxic-appearing or diaphoretic.  HENT:     Head: Normocephalic and atraumatic.     Nose: Nose normal.     Mouth/Throat:     Mouth: Mucous membranes are moist.     Pharynx: Oropharynx is clear.  Eyes:     General: No scleral icterus.    Extraocular Movements: Extraocular movements intact.  Cardiovascular:     Rate and Rhythm: Normal rate and regular rhythm.     Heart sounds: Normal heart sounds. No murmur heard.    No friction rub. No gallop.  Pulmonary:     Effort: Pulmonary effort is normal. No respiratory distress.     Breath sounds: Normal breath sounds. No wheezing, rhonchi or rales.  Abdominal:     General: There is distension.     Palpations: Abdomen is soft.     Tenderness: There is no abdominal tenderness. There is no guarding or rebound.     Comments: Hyperactive, elevated pitch bowel soudns  Musculoskeletal:     Cervical back: Neck supple.     Right lower leg: No edema.     Left lower leg: No edema.  Skin:    General: Skin is warm and dry.     Coloration: Skin is not jaundiced or pale.  Neurological:     General: No focal deficit present.     Mental Status: He is alert and oriented to person, place, and time. Mental status is at baseline.  Psychiatric:        Mood and Affect: Mood normal.        Behavior: Behavior normal.        Thought Content: Thought content normal.        Judgment: Judgment normal.      Laboratory Data Recent Labs  Lab 07/17/24 0329 07/18/24 0317 07/19/24 0440  WBC 9.9 8.2 7.7  HGB 15.8 14.3 13.1  HCT 45.8 41.3 38.5*  PLT 200 165 174   Recent Labs  Lab 07/17/24 0329 07/18/24 0317 07/19/24 0440  NA 137 131* 132*   K 3.8 3.5 3.3*  CL 102 103 102  CO2 24 21* 21*  BUN 10 15 11   CREATININE 0.68 0.61 0.73  CALCIUM 8.7* 8.4* 8.4*  PROT 6.6 6.4* 5.7*  BILITOT 1.6* 1.3* 1.0  ALKPHOS 46 37* 39  ALT 16 14 11   AST 18 13* 15  GLUCOSE 116* 121* 126*   No results for input(s): INR in the last 168 hours.    Imaging Studies: No results found.  Assessment:   # Choledocholithiasis - Status post ERCP on 7/23 with sphincterotomy and biliary sweep  #Ileus leading to abdominal discomfort and distention  # esophageal stenosis Status post dilation with 16.5 mm TTS balloon  #HIV on antiviral therapy  #Tobacco dependence  Plan:  KUB reviewed by me today demonstrating continued mild dilation, however, patient is symptomatically feeling better and is now passing bowel movements with regimen Continue twice daily MiraLAX  Continue nightly Senokot Can advance to full liquid diet if patient can tolerate As patient continues to improve can slowly advance diet Avoid opioids as possible and other agents to slow bowel motility Ambulate frequently  Should pt condition worsen, would replace NGT to LIWS Electrolyte correction per primary team and pharmacy as this can further exacerbate an ileus  Management of other medical comorbidities as per primary team  GI to sign off. Available as needed. Please do not hesitate to call regarding questions or concerns.  I personally performed the service.  Thank you for allowing us  to participate in this patient's care. Please don't hesitate to call if any questions or concerns arise.   Elspeth Ozell Jungling, DO [facility]  Portions of the record may have been created with voice recognition software. Occasional wrong-word or 'sound-a-like' substitutions may have occurred due to the inherent limitations of voice recognition software.  Read the chart carefully and recognize, using context, where substitutions may have occurred.

## 2024-07-19 NOTE — Plan of Care (Signed)

## 2024-07-20 DIAGNOSIS — R112 Nausea with vomiting, unspecified: Principal | ICD-10-CM

## 2024-07-20 LAB — COMPREHENSIVE METABOLIC PANEL WITH GFR
ALT: 12 U/L (ref 0–44)
AST: 14 U/L — ABNORMAL LOW (ref 15–41)
Albumin: 2 g/dL — ABNORMAL LOW (ref 3.5–5.0)
Alkaline Phosphatase: 36 U/L — ABNORMAL LOW (ref 38–126)
Anion gap: 8 (ref 5–15)
BUN: 11 mg/dL (ref 6–20)
CO2: 21 mmol/L — ABNORMAL LOW (ref 22–32)
Calcium: 8 mg/dL — ABNORMAL LOW (ref 8.9–10.3)
Chloride: 106 mmol/L (ref 98–111)
Creatinine, Ser: 0.69 mg/dL (ref 0.61–1.24)
GFR, Estimated: >60 mL/min (ref >60)
Glucose, Bld: 120 mg/dL — ABNORMAL HIGH (ref 70–99)
Potassium: 3.3 mmol/L — ABNORMAL LOW (ref 3.5–5.1)
Sodium: 135 mmol/L (ref 135–145)
Total Bilirubin: 0.9 mg/dL (ref 0.0–1.2)
Total Protein: 5 g/dL — ABNORMAL LOW (ref 6.5–8.1)

## 2024-07-20 LAB — CBC
HCT: 33 % — ABNORMAL LOW (ref 39.0–52.0)
Hemoglobin: 11.4 g/dL — ABNORMAL LOW (ref 13.0–17.0)
MCH: 32.8 pg (ref 26.0–34.0)
MCHC: 34.5 g/dL (ref 30.0–36.0)
MCV: 94.8 fL (ref 80.0–100.0)
Platelets: 168 K/uL (ref 150–400)
RBC: 3.48 MIL/uL — ABNORMAL LOW (ref 4.22–5.81)
RDW: 13.4 % (ref 11.5–15.5)
WBC: 9.3 K/uL (ref 4.0–10.5)
nRBC: 0 % (ref 0.0–0.2)

## 2024-07-20 MED ORDER — POTASSIUM CHLORIDE CRYS ER 20 MEQ PO TBCR
40.0000 meq | EXTENDED_RELEASE_TABLET | Freq: Once | ORAL | Status: AC
  Administered 2024-07-20: 40 meq via ORAL
  Filled 2024-07-20: qty 2

## 2024-07-20 NOTE — Progress Notes (Signed)
 [facility]- General Surgery  SURGICAL PROGRESS NOTE  Hospital Day(s): 6.   Post op day(s): 5 Days Post-Op.   Interval History:  Patient seen and examined. Patient reports doing well. GI has been managing post-ERCP ileus. States he is having bowel movements, mainly diarrhea. Has been tolerating full liquid diet. Plan was to discuss cholecystectomy once ileus had resolved.    Vital signs in last 24 hours: [min-max] current  Temp:  [97.9 F (36.6 C)-100.4 F (38 C)] 97.9 F (36.6 C) (07/28 0735) Pulse Rate:  [65-81] 72 (07/28 0735) Resp:  [16-24] 18 (07/28 0234) BP: (123-142)/(64-76) 142/75 (07/28 0735) SpO2:  [93 %-97 %] 97 % (07/28 0735)     Height: 5' 5 (165.1 cm) Weight: 76.4 kg BMI (Calculated): 28.03   Intake/Output last 2 shifts:  07/27 0701 - 07/28 0700 In: 4646.4 [P.O.:920; I.V.:3726.4] Out: -    Physical Exam:  Constitutional: alert, cooperative and no distress  Respiratory: breathing non-labored at rest  Cardiovascular: regular rate and sinus rhythm  Gastrointestinal: soft, non-tender, and less distended, non-tympanic  Labs:     Latest Ref Rng & Units 07/20/2024    3:35 AM 07/19/2024    4:40 AM 07/18/2024    3:17 AM  CBC  WBC 4.0 - 10.5 K/uL 9.3  7.7  8.2   Hemoglobin 13.0 - 17.0 g/dL 88.5  86.8  85.6   Hematocrit 39.0 - 52.0 % 33.0  38.5  41.3   Platelets 150 - 400 K/uL 168  174  165       Latest Ref Rng & Units 07/20/2024    3:35 AM 07/19/2024    4:40 AM 07/18/2024    3:17 AM  CMP  Glucose 70 - 99 mg/dL 879  873  878   BUN 6 - 20 mg/dL 11  11  15    Creatinine 0.61 - 1.24 mg/dL 9.30  9.26  9.38   Sodium 135 - 145 mmol/L 135  132  131   Potassium 3.5 - 5.1 mmol/L 3.3  3.3  3.5   Chloride 98 - 111 mmol/L 106  102  103   CO2 22 - 32 mmol/L 21  21  21    Calcium 8.9 - 10.3 mg/dL 8.0  8.4  8.4   Total Protein 6.5 - 8.1 g/dL 5.0  5.7  6.4   Total Bilirubin 0.0 - 1.2 mg/dL 0.9  1.0  1.3   Alkaline Phos 38 - 126 U/L 36  39  37   AST 15 - 41 U/L 14  15  13     ALT 0 - 44 U/L 12  11  14      Imaging studies: No new pertinent imaging studies   Assessment/Plan:  53 y.o. male with ileus post ERCP for choledocholithiasis, complicated by pertinent comorbidities including HIV.    - Ileus resolved, managed by GI team   - Advanced to soft diet   - Discussed cholecystectomy recommended at same admission as choledocholithiasis. Discussed robotic assisted laparoscopic cholecystomy. Patient would like to schedule surgery outpatient. States he is ready to go home. Labs are stable and currently asymptomatic. Plan to schedule cholecystectomy outpatient. He will call the office and schedule surgery at his earlier convenience.   - Patient is clear from surgical standpoint.   -- Gilmer Cea PA-C

## 2024-07-20 NOTE — Progress Notes (Signed)
 Timothy Copping, MD [facility]   [address], [address] [place], [place] [address] Phone: [phone] Fax : [phone]   Subjective: The patient states he is feeling much better today and had started moving his bowels.  He states that he has been having diarrhea with an has not been able to make it to the bathroom since he is hooked up to the IV pole and has soiled himself.  He states that his abdominal pain is much better.  The patient's KUB shows a significant decrease in the abdominal gas.   Objective: Vital signs in last 24 hours: Vitals:   07/19/24 1732 07/19/24 1954 07/20/24 0234 07/20/24 0735  BP: 134/64 130/75 123/70 (!) 142/75  Pulse: 80 78 65 72  Resp: (!) 24 16 18    Temp: (!) 100.4 F (38 C) 98.5 F (36.9 C) 98.4 F (36.9 C) 97.9 F (36.6 C)  TempSrc: Oral Oral Oral   SpO2: 94% 93% 95% 97%  Weight:      Height:       Weight change:   Intake/Output Summary (Last 24 hours) at 07/20/2024 0753 Last data filed at 07/20/2024 9367 Gross per 24 hour  Intake 4646.42 ml  Output --  Net 4646.42 ml     Exam: Heart:: Regular rate and rhythm or without murmur or extra heart sounds Lungs: normal and clear to auscultation and percussion Abdomen: Slightly distended soft with positive bowel sounds   Lab Results: @LABTEST2 @ Micro Results: No results found for this or any previous visit (from the past 240 hours). Studies/Results: DG Abd Portable 1V Result Date: 07/19/2024 CLINICAL DATA:  Ileus. EXAM: PORTABLE ABDOMEN - 1 VIEW COMPARISON:  Abdominal radiograph dated 07/16/2024. FINDINGS: Interval removal of the enteric tube. Mildly dilated short segment small bowel in the left lower abdomen measures up to 3 cm and may represent mild ileus. Overall significant improvement in bowel dilatation study of 07/16/2024. Air is noted throughout the colon. No free air. No acute osseous pathology. IMPRESSION: Significant improvement in bowel dilatation since 07/16/2024. Electronically Signed    By: Vanetta Chou M.D.   On: 07/19/2024 12:29   Medications: I have reviewed the patient's current medications. Scheduled Meds:  bictegravir-emtricitabine -tenofovir  AF  1 tablet Oral Daily   diatrizoate  meglumine -sodium  90 mL Per NG tube Once   enoxaparin  (LOVENOX ) injection  40 mg Subcutaneous Q24H   pantoprazole   40 mg Oral Daily   polyethylene glycol  17 g Oral BID   scopolamine   1 patch Transdermal Q72H   senna-docusate  1 tablet Oral QHS   simethicone   80 mg Oral QID   Continuous Infusions:  sodium chloride  75 mL/hr at 07/20/24 9367   promethazine  (PHENERGAN ) injection (IM or IVPB) Stopped (07/16/24 1627)   PRN Meds:.HYDROmorphone  (DILAUDID ) injection, oxyCODONE -acetaminophen , promethazine  (PHENERGAN ) injection (IM or IVPB)   Assessment: Principal Problem:   Common bile duct (CBD) obstruction Active Problems:   HIV DISEASE   TOBACCO USER   Abdominal pain   Ileus (HCC)    Plan: This patient has post ERCP ileus which has now appeared to improved with marked improvement on KUB yesterday.  The patient is now moving his bowels and passing gas from both the bottom and the top.  The patient is being followed by surgery for possible cholecystectomy.  Nothing further to do from a GI point of view at this time.  I will sign off.  Please call if any further GI concerns or questions.  We would like to thank you for  the opportunity to participate in the care of Timothy Salas..    LOS: 6 days   Timothy Copping, MD.FACG 07/20/2024, 7:53 AM Pager [phone] 7am-5pm  Check AMION for 5pm -7am coverage and on weekends

## 2024-07-20 NOTE — Discharge Summary (Addendum)
 Physician Discharge Summary  Timothy Salas Timothy Salas. FMW:[id number] DOB: 1971/03/11 DOA: 07/14/2024  PCP: Osa Geralds, NP  Admit date: 07/14/2024 Discharge date: 07/20/2024  Admitted From: home  Disposition:  home  Recommendations for Outpatient Follow-up:  Follow up with PCP in 1-2 weeks F/u w/ gen surg, Dr. Tye, in 1 week   Home Health: no  Equipment/Devices:  Discharge Condition: stable CODE STATUS: full  Diet recommendation: Regular  Brief/Interim Summary: HPI was taken from Dr. Sherre: Mr. Divante [name] is a 53 year old male with history of hepatic steatosis, HIV, currently on triple therapy, who presents to the emergency department for chief concerns of abdominal pain, nausea, vomiting.   Vitals in the ED showed temperature 98.1, respiration rate 17, heart rate 98, blood pressure 147/95, SpO2 96% on room air.   Serum sodium is 140, potassium 3.4, chloride 106, bicarb 23, BUN of 8, serum creatinine 0.83, EGFR greater than 60, nonfasting blood glucose 118, WBC 6.6, hemoglobin 15.9, platelets of 228.   Lipase was 55. UA was negative for leukocytes and nitrates.   MRCP w and wo contrast: Was read as mild dilatation of the common bile duct at 8 mm in diameter with truncated distal margin and meniscus appearing favoring distal choledocholithiasis.   ED treatment: Morphine  4 mg IV one-time dose, ondansetron  4 mg IV one-time dose, sodium chloride  1 L bolus ---------------------------------- At bedside, patient able to tell me his first and last name, age, location, current calendar year.   He reports he has nausea and vomiting yesterday.  The pain started this morning at around 3:30 AM.  He denies trauma to his person. He reports he is never felt this way before.   He reports his husband took his temperature this morning and states it was either 100.1 or 101 this.  Patient is unsure.  Patient took a Tylenol  before coming to the ED.   He denies chest pain, dysuria,  hematuria, diarrhea.  Discharge Diagnoses:  Principal Problem:   Common bile duct (CBD) obstruction Active Problems:   HIV DISEASE   TOBACCO USER   Abdominal pain   Ileus (HCC)   Nausea vomiting and diarrhea  Common bile duct obstruction: s/p ERCP in which biliary sphincterotomy was performed & the biliary tree was swept and sludge was found. GI following and recs apprec. Oxy, dilaudid  prn for pain. Cholecystectomy will be scheduled outpatient as per pt's preference as per gen surg. Gen surg following and recs apprec    Likely ileus: had several BMs today so far. Encouraged ambulation. Continue w/ miralax , senokot as per GI. Advanced to regular diet. Resolved    Smoker: smokes 4-5 cigarettes a daily. Received smoking cessation counseling already    HIV: continue on home dose of biktarvy     Discharge Instructions  Discharge Instructions     Diet general   Complete by: As directed    Discharge instructions   Complete by: As directed    F/u w/ PCP in 1-2 weeks. F/u w/ gen surg, Dr. Tye, in 1 week   Increase activity slowly   Complete by: As directed       Allergies as of 07/20/2024       Reactions   Amoxicillin Dermatitis, Hives, Itching, Nausea Only   REACTION: hives        Medication List     STOP taking these medications    cyclobenzaprine  10 MG tablet Commonly known as: FLEXERIL    elvitegravir-cobicistat-emtricitabine -tenofovir  150-150-200-300 MG Tabs tablet Commonly known as: STRIBILD  ibuprofen  600 MG tablet Commonly known as: ADVIL    Imiquimod Pump 3.75 % Crea       TAKE these medications    Biktarvy  50-200-25 MG Tabs tablet Generic drug: bictegravir-emtricitabine -tenofovir  AF Take 1 tablet by mouth daily.   omeprazole 20 MG capsule Commonly known as: PRILOSEC Take 20 mg by mouth daily.        Allergies  Allergen Reactions   Amoxicillin Dermatitis, Hives, Itching and Nausea Only    REACTION: hives    Consultations: GI Gen surg     Procedures/Studies: DG Abd Portable 1V Result Date: 07/19/2024 CLINICAL DATA:  Ileus. EXAM: PORTABLE ABDOMEN - 1 VIEW COMPARISON:  Abdominal radiograph dated 07/16/2024. FINDINGS: Interval removal of the enteric tube. Mildly dilated short segment small bowel in the left lower abdomen measures up to 3 cm and may represent mild ileus. Overall significant improvement in bowel dilatation study of 07/16/2024. Air is noted throughout the colon. No free air. No acute osseous pathology. IMPRESSION: Significant improvement in bowel dilatation since 07/16/2024. Electronically Signed   By: Vanetta Chou M.D.   On: 07/19/2024 12:29   DG Abd Portable 1V-Small Bowel Protocol-Position Verification Result Date: 07/16/2024 CLINICAL DATA:  [id number] Encounter for imaging study to confirm nasogastric (NG) tube placement [id number] EXAM: PORTABLE ABDOMEN - 1 VIEW COMPARISON:  None Available. FINDINGS: Esophagogastric tube terminates in the region of the GE junction. The last side hole is in the distal esophagus. 8 cm of advancement recommended. Scattered nondistended segments of small bowel. Gaseous distention of the transverse and sigmoid colon. No pneumoperitoneum. No organomegaly or radiopaque calculi. No acute fracture or destructive lesion. Small bilateral pleural effusions. IMPRESSION: Esophagogastric tube terminates in the region of the GE junction. 8 cm of advancement recommended. Electronically Signed   By: Rogelia Myers M.D.   On: 07/16/2024 15:45   DG Abd 1 View Result Date: 07/16/2024 CLINICAL DATA:  Abdominal distention. EXAM: ABDOMEN - 1 VIEW COMPARISON:  CT chest abdomen pelvis 07/14/2024. FINDINGS: Loops of mildly dilated gas-filled bowel within the mid abdomen measuring up to 3.3 cm in diameter. Dense stool noted over the right lower quadrant within the cecum and ascending colon. There is additional prominent gas-filled transverse colon appreciated. Supine radiograph without definite free air appreciated.  Osseous structures are unremarkable. IMPRESSION: Dilated gas-filled small bowel within the mid abdomen concerning for ileus versus developing small bowel obstruction. Recommend correlation with abdominal exam and serial radiographs. Moderate stool burden in the right lower quadrant. Electronically Signed   By: Donnice Mania M.D.   On: 07/16/2024 12:31   DG C-Arm 1-60 Min-No Report Result Date: 07/15/2024 Fluoroscopy was utilized by the requesting physician.  No radiographic interpretation.   MR ABDOMEN MRCP W WO CONTAST Result Date: 07/14/2024 CLINICAL DATA:  Right upper quadrant abdominal pain nausea and vomiting EXAM: MRI ABDOMEN WITHOUT AND WITH CONTRAST (INCLUDING MRCP) TECHNIQUE: Multiplanar multisequence MR imaging of the abdomen was performed both before and after the administration of intravenous contrast. Heavily T2-weighted images of the biliary and pancreatic ducts were obtained, and three-dimensional MRCP images were rendered by post processing. CONTRAST:  7mL GADAVIST  GADOBUTROL  1 MMOL/ML IV SOLN COMPARISON:  CT scan 07/14/2024 FINDINGS: Lower chest: Unremarkable Hepatobiliary: Mild hepatic steatosis. No gallbladder wall thickening. Common bile duct mildly dilated at 0.8 cm in diameter with a truncated distal margin and meniscus appearance as on image 74 series 14 favoring distal choledocholithiasis. No significant wall enhancement in the distal CBD. No abnormal enhancing lesion in the pancreatic head along the  distal common bile duct. No significant intrahepatic biliary dilatation. The gallbladder appears unremarkable.  No pericholecystic fluid. Pancreas:  Partial pancreas divisum. Spleen:  Unremarkable Adrenals/Urinary Tract:  Unremarkable Stomach/Bowel: Transverse duodenal diverticulum. Vascular/Lymphatic:  Abdominal aortic atherosclerosis. Other:  No supplemental non-categorized findings. Musculoskeletal: Unremarkable IMPRESSION: 1. Mild dilatation of the common bile duct at 0.8 cm in  diameter with a truncated distal margin and meniscus appearance favoring distal choledocholithiasis. No significant wall enhancement in the distal CBD. No abnormal enhancing lesion in the pancreatic head along the distal common bile duct. 2. Mild hepatic steatosis. 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 5.  Aortic Atherosclerosis (ICD10-I70.0). Electronically Signed   By: Ryan Salvage M.D.   On: 07/14/2024 14:32   MR 3D Recon At Scanner Result Date: 07/14/2024 CLINICAL DATA:  Right upper quadrant abdominal pain nausea and vomiting EXAM: MRI ABDOMEN WITHOUT AND WITH CONTRAST (INCLUDING MRCP) TECHNIQUE: Multiplanar multisequence MR imaging of the abdomen was performed both before and after the administration of intravenous contrast. Heavily T2-weighted images of the biliary and pancreatic ducts were obtained, and three-dimensional MRCP images were rendered by post processing. CONTRAST:  7mL GADAVIST  GADOBUTROL  1 MMOL/ML IV SOLN COMPARISON:  CT scan 07/14/2024 FINDINGS: Lower chest: Unremarkable Hepatobiliary: Mild hepatic steatosis. No gallbladder wall thickening. Common bile duct mildly dilated at 0.8 cm in diameter with a truncated distal margin and meniscus appearance as on image 74 series 14 favoring distal choledocholithiasis. No significant wall enhancement in the distal CBD. No abnormal enhancing lesion in the pancreatic head along the distal common bile duct. No significant intrahepatic biliary dilatation. The gallbladder appears unremarkable.  No pericholecystic fluid. Pancreas:  Partial pancreas divisum. Spleen:  Unremarkable Adrenals/Urinary Tract:  Unremarkable Stomach/Bowel: Transverse duodenal diverticulum. Vascular/Lymphatic:  Abdominal aortic atherosclerosis. Other:  No supplemental non-categorized findings. Musculoskeletal: Unremarkable IMPRESSION: 1. Mild dilatation of the common bile duct at 0.8 cm in diameter with a truncated distal margin and meniscus  appearance favoring distal choledocholithiasis. No significant wall enhancement in the distal CBD. No abnormal enhancing lesion in the pancreatic head along the distal common bile duct. 2. Mild hepatic steatosis. 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 5.  Aortic Atherosclerosis (ICD10-I70.0). Electronically Signed   By: Ryan Salvage M.D.   On: 07/14/2024 14:32   CT ABDOMEN PELVIS W CONTRAST Result Date: 07/14/2024 CLINICAL DATA:  Acute nonlocalized abdominal pain EXAM: CT ABDOMEN AND PELVIS WITH CONTRAST TECHNIQUE: Multidetector CT imaging of the abdomen and pelvis was performed using the standard protocol following bolus administration of intravenous contrast. RADIATION DOSE REDUCTION: This exam was performed according to the departmental dose-optimization program which includes automated exposure control, adjustment of the mA and/or kV according to patient size and/or use of iterative reconstruction technique. CONTRAST:  OMNIPAQUE  IOHEXOL  300 MG/ML  SOLN COMPARISON:  09/16/2017 FINDINGS: Lower chest: No acute abnormality.  Small hiatal hernia Hepatobiliary: Mild hepatic steatosis. No enhancing intrahepatic mass. The extrahepatic bile duct is mildly dilated measuring up to 10 mm in diameter and there is a a subtle filling defect within the distal duct at the level of the into a, best seen on image # 38/2 and 44/5, suspicious for intraductal debris or calculi. No intrahepatic biliary ductal dilation. Gallbladder unremarkable. Pancreas: Unremarkable Spleen: Unremarkable Adrenals/Urinary Tract: Adrenal glands are unremarkable. Kidneys are normal, without renal calculi, focal lesion, or hydronephrosis. Bladder is unremarkable. Stomach/Bowel: Stomach is within normal limits. Appendix appears normal. No evidence of bowel wall thickening, distention, or inflammatory changes. No free intraperitoneal gas or  fluid Vascular/Lymphatic: Aortic atherosclerosis. No enlarged abdominal or  pelvic lymph nodes. Reproductive: Prostate is unremarkable. Other: None significant Musculoskeletal: No acute or significant osseous findings. IMPRESSION: 1. Mild dilation of the extrahepatic bile duct measuring up to 10 mm in diameter with a subtle filling defect within the distal duct at the level of the ampulla, suspicious for intraductal debris or calculi. Correlate with liver enzymes and consider further evaluation with MRCP. 2. Mild hepatic steatosis. 3. Small hiatal hernia. Electronically Signed   By: Dorethia Molt M.D.   On: 07/14/2024 11:57   (Echo, Carotid, EGD, Colonoscopy, ERCP)    Subjective: Pt c/o fatigue    Discharge Exam: Vitals:   07/20/24 0234 07/20/24 0735  BP: 123/70 (!) 142/75  Pulse: 65 72  Resp: 18   Temp: 98.4 F (36.9 C) 97.9 F (36.6 C)  SpO2: 95% 97%   Vitals:   07/19/24 1732 07/19/24 1954 07/20/24 0234 07/20/24 0735  BP: 134/64 130/75 123/70 (!) 142/75  Pulse: 80 78 65 72  Resp: (!) 24 16 18    Temp: (!) 100.4 F (38 C) 98.5 F (36.9 C) 98.4 F (36.9 C) 97.9 F (36.6 C)  TempSrc: Oral Oral Oral   SpO2: 94% 93% 95% 97%  Weight:      Height:        General: Pt is alert, awake, not in acute distress Cardiovascular:  S1/S2 +, no rubs, no gallops Respiratory: CTA bilaterally, no wheezing, no rhonchi Abdominal: Soft, NT, ND, normal bowel sounds  Extremities: no edema, no cyanosis    The results of significant diagnostics from this hospitalization (including imaging, microbiology, ancillary and laboratory) are listed below for reference.     Microbiology: No results found for this or any previous visit (from the past 240 hours).   Labs: BNP (last 3 results) No results for input(s): BNP in the last 8760 hours. Basic Metabolic Panel: Recent Labs  Lab 07/14/24 1621 07/15/24 0336 07/16/24 0346 07/17/24 0329 07/18/24 0317 07/19/24 0440 07/20/24 0335  NA  --    < > 138 137 131* 132* 135  K  --    < > 3.9 3.8 3.5 3.3* 3.3*  CL  --    <  > 104 102 103 102 106  CO2  --    < > 25 24 21* 21* 21*  GLUCOSE  --    < > 122* 116* 121* 126* 120*  BUN  --    < > 6 10 15 11 11   CREATININE  --    < > 0.70 0.68 0.61 0.73 0.69  CALCIUM  --    < > 9.0 8.7* 8.4* 8.4* 8.0*  MG 1.7  --   --   --   --   --   --    < > = values in this interval not displayed.   Liver Function Tests: Recent Labs  Lab 07/16/24 0346 07/17/24 0329 07/18/24 0317 07/19/24 0440 07/20/24 0335  AST 40 18 13* 15 14*  ALT 26 16 14 11 12   ALKPHOS 65 46 37* 39 36*  BILITOT 1.1 1.6* 1.3* 1.0 0.9  PROT 6.7 6.6 6.4* 5.7* 5.0*  ALBUMIN 3.3* 3.1* 2.7* 2.4* 2.0*   Recent Labs  Lab 07/14/24 0923  LIPASE 55*   No results for input(s): AMMONIA in the last 168 hours. CBC: Recent Labs  Lab 07/16/24 0346 07/17/24 0329 07/18/24 0317 07/19/24 0440 07/20/24 0335  WBC 14.2* 9.9 8.2 7.7 9.3  HGB 14.9 15.8 14.3 13.1  11.4*  HCT 43.1 45.8 41.3 38.5* 33.0*  MCV 95.8 95.2 96.9 95.5 94.8  PLT 218 200 165 174 168   Cardiac Enzymes: No results for input(s): CKTOTAL, CKMB, CKMBINDEX, TROPONINI in the last 168 hours. BNP: Invalid input(s): POCBNP CBG: No results for input(s): GLUCAP in the last 168 hours. D-Dimer No results for input(s): DDIMER in the last 72 hours. Hgb A1c No results for input(s): HGBA1C in the last 72 hours. Lipid Profile No results for input(s): CHOL, HDL, LDLCALC, TRIG, CHOLHDL, LDLDIRECT in the last 72 hours. Thyroid function studies No results for input(s): TSH, T4TOTAL, T3FREE, THYROIDAB in the last 72 hours.  Invalid input(s): FREET3 Anemia work up No results for input(s): VITAMINB12, FOLATE, FERRITIN, TIBC, IRON, RETICCTPCT in the last 72 hours. Urinalysis    Component Value Date/Time   COLORURINE YELLOW (A) 07/14/2024 1010   APPEARANCEUR CLEAR (A) 07/14/2024 1010   LABSPEC 1.005 07/14/2024 1010   PHURINE 6.0 07/14/2024 1010   GLUCOSEU NEGATIVE 07/14/2024 1010   HGBUR NEGATIVE  07/14/2024 1010   BILIRUBINUR NEGATIVE 07/14/2024 1010   KETONESUR NEGATIVE 07/14/2024 1010   PROTEINUR NEGATIVE 07/14/2024 1010   UROBILINOGEN 2.0 (H) 12/28/2008 2149   NITRITE NEGATIVE 07/14/2024 1010   LEUKOCYTESUR NEGATIVE 07/14/2024 1010   Sepsis Labs Recent Labs  Lab 07/17/24 0329 07/18/24 0317 07/19/24 0440 07/20/24 0335  WBC 9.9 8.2 7.7 9.3   Microbiology No results found for this or any previous visit (from the past 240 hours).   Time coordinating discharge:34 minutes  SIGNED:   Anthony CHRISTELLA Pouch, MD  [facility] 07/20/2024, 12:36 PM Pager   If 7PM-7AM, please contact night-coverage [email or web address]

## 2024-08-14 ENCOUNTER — Encounter
Admission: RE | Admit: 2024-08-14 | Discharge: 2024-08-14 | Disposition: A | Discharge status: Home / Self Care | Payer: Self-pay | Source: Ambulatory Visit | Attending: Surgery | Admitting: Surgery

## 2024-08-14 ENCOUNTER — Ambulatory Visit: Payer: Self-pay | Admitting: Surgery

## 2024-08-14 ENCOUNTER — Other Ambulatory Visit: Payer: Self-pay

## 2024-08-14 HISTORY — DX: Anxiety disorder, unspecified: F41.9

## 2024-08-14 HISTORY — DX: Pneumonia, unspecified organism: J18.9

## 2024-08-14 HISTORY — DX: Gastro-esophageal reflux disease without esophagitis: K21.9

## 2024-08-14 HISTORY — DX: Personal history of urinary calculi: Z87.442

## 2024-08-14 HISTORY — DX: Other complications of anesthesia, initial encounter: T88.59XA

## 2024-08-14 NOTE — H&P (View-Only) (Signed)
 HISTORY OF PRESENT ILLNESS (HPI):  53 y.o. male presented to [facility] ED on 07/14/24 for evaluation of generalized abdominal pain, nausea/vomiting and 1 episode of diarrhea that morning. States his symptoms started the day before.  Pain radiates to right upper quadrant pain. No known aggravating or alleviating factors.  Labs show known leukocytosis with a white blood count of 6.6, normal hemoglobin of 15.9.  Patient had normal LFTs, alka phos, and normal total bilirubin.  His lipase was slightly elevated at 55.  CT of abdomen showed mild dilation of the extrahepatic bile duct and subtle filling defect suspicious for intraductal debris or calculi.  No evidence of small bowel obstruction or ileus. MRCP was then ordered which was positive for choledocholithiasis.  GI was consulted.  ERCP was done yesterday by Dr. Jinny, successfully removing sludge and biliary sphincterotomy.   This morning patient is presenting with generalized abdominal pain, nausea, vomiting, and reports feeling distended.  States he is feeling more full than usual.  His last bowel movement was 2 days ago.  He admits to not being able to pass gas.  Pain medication seem to improve symptoms.  No known aggravating factors.  Patient denies any recent abdominal surgeries.  States he had an abscess in intestine in the 1990s.    This morning patient was afebrile and hypertensive with a BP of 145/76,  heart rate of 66 and RR of 18.  His labs post-ERCP show an elevated white blood count of 14.2 and normal hemoglobin of 14.9.  Rest of labs including LFTs, alkaline phosphatase, and total bilirubin are within normal limits. Abdominal x-ray has been ordered by GI.    Surgery is consulted physician Dr. Trudy in this context for evaluation and management of ileus vs small bowel obstruction.    PAST MEDICAL HISTORY (PMH):      Past Medical History:  Diagnosis Date   Hepatic steatosis     HIV (human immunodeficiency virus infection) (HCC)             PAST SURGICAL HISTORY (PSH):       Past Surgical History:  Procedure Laterality Date   ERCP N/A 07/15/2024    Procedure: ERCP, WITH INTERVENTION IF INDICATED;  Surgeon: Jinny Carmine, MD;  Location: [facility] ENDOSCOPY;  Service: Endoscopy;  Laterality: N/A;   ESOPHAGOGASTRODUODENOSCOPY   07/15/2024    Procedure: EGD (ESOPHAGOGASTRODUODENOSCOPY);  Surgeon: Jinny Carmine, MD;  Location: [facility] ENDOSCOPY;  Service: Endoscopy;;          MEDICATIONS:         Prior to Admission medications   Medication Sig Start Date End Date Taking? Authorizing Provider  BIKTARVY  50-200-25 MG TABS tablet Take 1 tablet by mouth daily.     Yes [provider]  omeprazole (PRILOSEC) 20 MG capsule Take 20 mg by mouth daily.     Yes [provider]  cyclobenzaprine  (FLEXERIL ) 10 MG tablet Take 1 tablet (10 mg total) by mouth 3 (three) times daily as needed. Patient not taking: Reported on 07/14/2024 04/22/24     Jacolyn Pae, MD  elvitegravir-cobicistat-emtricitabine -tenofovir  (STRIBILD) 150-150-200-300 MG TABS tablet Take 1 tablet by mouth daily with breakfast. Patient not taking: Reported on 07/14/2024 08/25/14     Efrain Lamar ORN, MD  ibuprofen  (ADVIL ) 600 MG tablet Take 1 tablet (600 mg total) by mouth every 6 (six) hours as needed. Patient not taking: Reported on 07/14/2024 04/22/24     Jacolyn Pae, MD  Imiquimod Pump 3.75 % CREA Apply 7.5 g topically daily. Patient  not taking: Reported on 07/14/2024 07/13/24     [provider]      ALLERGIES:  Allergies       Allergies  Allergen Reactions   Amoxicillin Dermatitis, Hives, Itching and Nausea Only      REACTION: hives        SOCIAL HISTORY:  Social History         Socioeconomic History   Marital status: Single      Spouse name: Not on file   Number of children: Not on file   Years of education: Not on file   Highest education level: Not on file  Occupational History   Not on file  Tobacco Use   Smoking status:  Every Day      Current packs/day: 0.50      Types: Cigarettes   Smokeless tobacco: Never   Tobacco comments:      e-cigarettes  Vaping Use   Vaping status: Never Used  Substance and Sexual Activity   Alcohol use: Yes      Comment: occassionally   Drug use: Yes      Types: Marijuana      Comment: daily use   Sexual activity: Yes      Partners: Male      Comment: pt declines condoms  Other Topics Concern   Not on file  Social History Narrative   Not on file    Social Drivers of Health        Financial Resource Strain: Not on file  Food Insecurity: No Food Insecurity (07/15/2024)    Hunger Vital Sign     Worried About Running Out of Food in the Last Year: Never true     Ran Out of Food in the Last Year: Never true  Transportation Needs: No Transportation Needs (07/15/2024)    PRAPARE - Therapist, art (Medical): No     Lack of Transportation (Non-Medical): No  Physical Activity: Not on file  Stress: Not on file  Social Connections: Not on file  Intimate Partner Violence: Not At Risk (07/15/2024)    Humiliation, Afraid, Rape, and Kick questionnaire     Fear of Current or Ex-Partner: No     Emotionally Abused: No     Physically Abused: No     Sexually Abused: No      FAMILY HISTORY:       Family History  Problem Relation Age of Onset   Heart disease Father     Diabetes Maternal Grandmother     Diabetes Paternal Grandmother     Heart disease Paternal Grandfather              REVIEW OF SYSTEMS:  Review of Systems  Constitutional:  Negative for chills and fever.  Respiratory:  Negative for shortness of breath and wheezing.   Cardiovascular:  Negative for chest pain and palpitations.  Gastrointestinal:  Positive for abdominal pain, nausea and vomiting.     VITAL SIGNS:  Temp:  [96.4 F (35.8 C)-98.4 F (36.9 C)] 97.8 F (36.6 C) (07/24 0839) Pulse Rate:  [66-85] 66 (07/24 0839) Resp:  [15-20] 18 (07/24 0839) BP: (92-145)/(61-93)  145/76 (07/24 0839) SpO2:  [90 %-98 %] 95 % (07/24 0839) Weight:  [76.4 kg] 76.4 kg (07/23 1347)     Height: 5' 5 (165.1 cm) Weight: 76.4 kg BMI (Calculated): 28.03    INTAKE/OUTPUT:  07/23 0701 - 07/24 0700 In: 240 [P.O.:240] Out: -    PHYSICAL EXAM:  Physical Exam Constitutional:      Appearance: He is well-developed.  HENT:     Head: Normocephalic and atraumatic.  Cardiovascular:     Rate and Rhythm: Normal rate and regular rhythm.  Pulmonary:     Effort: Pulmonary effort is normal.     Breath sounds: Normal breath sounds.  Abdominal:     General: There is distension.     Palpations: Abdomen is soft.     Tenderness: There is generalized abdominal tenderness.  Neurological:     Mental Status: He is alert.       Labs:      Latest Ref Rng & Units 07/16/2024    3:46 AM 07/15/2024    3:36 AM 07/14/2024    9:23 AM  CBC  WBC 4.0 - 10.5 K/uL 14.2  5.3  6.6   Hemoglobin 13.0 - 17.0 g/dL 85.0  85.7  84.0   Hematocrit 39.0 - 52.0 % 43.1  42.0  47.4   Platelets 150 - 400 K/uL 218  198  228         Latest Ref Rng & Units 07/16/2024    3:46 AM 07/15/2024    3:36 AM 07/14/2024    9:23 AM  CMP  Glucose 70 - 99 mg/dL 877  893  881   BUN 6 - 20 mg/dL 6  6  8    Creatinine 0.61 - 1.24 mg/dL 9.29  9.23  9.16   Sodium 135 - 145 mmol/L 138  137  140   Potassium 3.5 - 5.1 mmol/L 3.9  3.6  3.4   Chloride 98 - 111 mmol/L 104  107  106   CO2 22 - 32 mmol/L 25  25  23    Calcium 8.9 - 10.3 mg/dL 9.0  8.5  9.3   Total Protein 6.5 - 8.1 g/dL 6.7    7.3   Total Bilirubin 0.0 - 1.2 mg/dL 1.1    0.6   Alkaline Phos 38 - 126 U/L 65    76   AST 15 - 41 U/L 40    20   ALT 0 - 44 U/L 26    16       Imaging studies:  CLINICAL DATA:  Acute nonlocalized abdominal pain   EXAM: 07/14/24 CT ABDOMEN AND PELVIS WITH CONTRAST   TECHNIQUE: Multidetector CT imaging of the abdomen and pelvis was performed using the standard protocol following bolus administration of intravenous contrast.    RADIATION DOSE REDUCTION: This exam was performed according to the departmental dose-optimization program which includes automated exposure control, adjustment of the mA and/or kV according to patient size and/or use of iterative reconstruction technique.   CONTRAST:  OMNIPAQUE  IOHEXOL  300 MG/ML  SOLN   COMPARISON:  09/16/2017   FINDINGS: Lower chest: No acute abnormality.  Small hiatal hernia   Hepatobiliary: Mild hepatic steatosis. No enhancing intrahepatic mass. The extrahepatic bile duct is mildly dilated measuring up to 10 mm in diameter and there is a a subtle filling defect within the distal duct at the level of the into a, best seen on image # 38/2 and 44/5, suspicious for intraductal debris or calculi. No intrahepatic biliary ductal dilation. Gallbladder unremarkable.   Pancreas: Unremarkable   Spleen: Unremarkable   Adrenals/Urinary Tract: Adrenal glands are unremarkable. Kidneys are normal, without renal calculi, focal lesion, or hydronephrosis. Bladder is unremarkable.   Stomach/Bowel: Stomach is within normal limits. Appendix appears normal. No evidence of bowel wall thickening, distention, or inflammatory changes. No free  intraperitoneal gas or fluid   Vascular/Lymphatic: Aortic atherosclerosis. No enlarged abdominal or pelvic lymph nodes.   Reproductive: Prostate is unremarkable.   Other: None significant   Musculoskeletal: No acute or significant osseous findings.   IMPRESSION: 1. Mild dilation of the extrahepatic bile duct measuring up to 10 mm in diameter with a subtle filling defect within the distal duct at the level of the ampulla, suspicious for intraductal debris or calculi. Correlate with liver enzymes and consider further evaluation with MRCP. 2. Mild hepatic steatosis. 3. Small hiatal hernia.     Electronically Signed   By: Dorethia Molt M.D.   On: 07/14/2024 11:57   CLINICAL DATA:  Right upper quadrant abdominal pain nausea  and vomiting   EXAM: 07/14/24 MRI ABDOMEN WITHOUT AND WITH CONTRAST (INCLUDING MRCP)   TECHNIQUE: Multiplanar multisequence MR imaging of the abdomen was performed both before and after the administration of intravenous contrast. Heavily T2-weighted images of the biliary and pancreatic ducts were obtained, and three-dimensional MRCP images were rendered by post processing.   CONTRAST:  7mL GADAVIST  GADOBUTROL  1 MMOL/ML IV SOLN   COMPARISON:  CT scan 07/14/2024   FINDINGS: Lower chest: Unremarkable   Hepatobiliary: Mild hepatic steatosis. No gallbladder wall thickening.   Common bile duct mildly dilated at 0.8 cm in diameter with a truncated distal margin and meniscus appearance as on image 74 series 14 favoring distal choledocholithiasis. No significant wall enhancement in the distal CBD. No abnormal enhancing lesion in the pancreatic head along the distal common bile duct. No significant intrahepatic biliary dilatation.   The gallbladder appears unremarkable.  No pericholecystic fluid.   Pancreas:  Partial pancreas divisum.   Spleen:  Unremarkable   Adrenals/Urinary Tract:  Unremarkable   Stomach/Bowel: Transverse duodenal diverticulum.   Vascular/Lymphatic:  Abdominal aortic atherosclerosis.   Other:  No supplemental non-categorized findings.   Musculoskeletal: Unremarkable   IMPRESSION: 1. Mild dilatation of the common bile duct at 0.8 cm in diameter with a truncated distal margin and meniscus appearance favoring distal choledocholithiasis. No significant wall enhancement in the distal CBD. No abnormal enhancing lesion in the pancreatic head along the distal common bile duct. 2. Mild hepatic steatosis. 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 5.  Aortic Atherosclerosis (ICD10-I70.0).     Electronically Signed   By: Ryan Salvage M.D.   On: 07/14/2024 14:32     Assessment/Plan: 53 y.o. male with ileus vs small bowel  obstruction post-ERCP for choledocholithiasis, complicated by pertinent comorbidities including HIV.               - Stable vital signs             - Awaiting on abdominal x-ray report from this morning. Imaging shows dilatation of bowel             - Manage conservatively for now with NGT, NPO and IV fluids. Plan for small bowel protocol and will follow progress with x-rays              - Discussed the recommendation of cholecystectomy to prevent future recurrence of choledocholithiasis or other complications.  Discussed robotic-assisted laparoscopic cholecystectomy.  Will discuss further once patient improves clinically and is less distended             - Pain management and DVT prophylaxis    Thank you for the opportunity to participate in this patient's care.

## 2024-08-14 NOTE — Patient Instructions (Addendum)
 Your procedure is scheduled on: 08/21/24 - Friday Report to the Registration Desk on the 1st floor of the [facility]. To find out your arrival time, please call [phone] between 1PM - 3PM on: 08/20/24 - Thursday If your arrival time is 6:00 am, do not arrive before that time as the [facility] entrance doors do not open until 6:00 am.  REMEMBER: Instructions that are not followed completely may result in serious medical risk, up to and including death; or upon the discretion of your surgeon and anesthesiologist your surgery may need to be rescheduled.  Do not eat food after midnight the night before surgery.  No gum chewing or hard candies.  You may however, drink CLEAR liquids up to 2 hours before you are scheduled to arrive for your surgery. Do not drink anything within 2 hours of your scheduled arrival time.  Clear liquids include: - water  - apple juice without pulp - gatorade (not RED colors) - black coffee or tea (Do NOT add milk or creamers to the coffee or tea) Do NOT drink anything that is not on this list.   One week prior to surgery: Stop Anti-inflammatories (NSAIDS) such as Advil , Aleve , Ibuprofen , Motrin , Naproxen , Naprosyn  and Aspirin based products such as Excedrin, Goody's Powder, BC Powder. You may take Tylenol  if needed for pain up until the day of surgery.  Stop ANY OVER THE COUNTER supplements until after surgery.  ON THE DAY OF SURGERY ONLY TAKE THESE MEDICATIONS WITH SIPS OF WATER:  BIKTARVY   omeprazole (PRILOSEC)    No Alcohol for 24 hours before or after surgery.  No Smoking including e-cigarettes for 24 hours before surgery.  No chewable tobacco products for at least 6 hours before surgery.  No nicotine  patches on the day of surgery.  Do not use any recreational drugs for at least a week (preferably 2 weeks) before your surgery.  Please be advised that the combination of cocaine and anesthesia may have negative outcomes, up to and including  death. If you test positive for cocaine, your surgery will be cancelled.  On the morning of surgery brush your teeth with toothpaste and water, you may rinse your mouth with mouthwash if you wish. Do not swallow any toothpaste or mouthwash.  Use CHG Soap or wipes as directed on instruction sheet.  Do not wear jewelry, make-up, hairpins, clips or nail polish.  For welded (permanent) jewelry: bracelets, anklets, waist bands, etc.  Please have this removed prior to surgery.  If it is not removed, there is a chance that hospital personnel will need to cut it off on the day of surgery.  Do not wear lotions, powders, or perfumes.   Do not shave body hair from the neck down 48 hours before surgery.  Contact lenses, hearing aids and dentures may not be worn into surgery.  Do not bring valuables to the hospital. [facility] is not responsible for any missing/lost belongings or valuables.   Notify your doctor if there is any change in your medical condition (cold, fever, infection).  Wear comfortable clothing (specific to your surgery type) to the hospital.  After surgery, you can help prevent lung complications by doing breathing exercises.  Take deep breaths and cough every 1-2 hours. Your doctor may order a device called an Incentive Spirometer to help you take deep breaths.  When coughing or sneezing, hold a pillow firmly against your incision with both hands. This is called "splinting." Doing this helps protect your incision. It also  decreases belly discomfort.  If you are being admitted to the hospital overnight, leave your suitcase in the car. After surgery it may be brought to your room.  In case of increased patient census, it may be necessary for you, the patient, to continue your postoperative care in the Same Day Surgery department.  If you are being discharged the day of surgery, you will not be allowed to drive home. You will need a responsible individual to drive you home and  stay with you for 24 hours after surgery.   If you are taking public transportation, you will need to have a responsible individual with you.  Please call the Pre-admissions Testing Dept. at [phone] if you have any questions about these instructions.  Surgery Visitation Policy:  Patients having surgery or a procedure may have two visitors.  Children under the age of 40 must have an adult with them who is not the patient.  Inpatient Visitation:    Visiting hours are 7 a.m. to 8 p.m. Up to four visitors are allowed at one time in a patient room. The visitors may rotate out with other people during the day.  One visitor age 37 or older may stay with the patient overnight and must be in the room by 8 p.m.   Merchandiser, retail to address health-related social needs:  [email or web address]                                                                                                             Preparing for Surgery with CHLORHEXIDINE GLUCONATE (CHG) Soap  Chlorhexidine Gluconate (CHG) Soap  o An antiseptic cleaner that kills germs and bonds with the skin to continue killing germs even after washing  o Used for showering the night before surgery and morning of surgery  Before surgery, you can play an important role by reducing the number of germs on your skin.  CHG (Chlorhexidine gluconate) soap is an antiseptic cleanser which kills germs and bonds with the skin to continue killing germs even after washing.  Please do not use if you have an allergy to CHG or antibacterial soaps. If your skin becomes reddened/irritated stop using the CHG.  1. Shower the NIGHT BEFORE SURGERY and the MORNING OF SURGERY with CHG soap.  2. If you choose to wash your hair, wash your hair first as usual with your normal shampoo.  3. After shampooing, rinse your hair and body thoroughly to remove the shampoo.  4. Use CHG as you would any other liquid soap. You can apply CHG  directly to the skin and wash gently with a scrungie or a clean washcloth.  5. Apply the CHG soap to your body only from the neck down. Do not use on open wounds or open sores. Avoid contact with your eyes, ears, mouth, and genitals (private parts). Wash face and genitals (private parts) with your normal soap.  6. Wash thoroughly, paying special attention to the area where your surgery will be performed.  7. Thoroughly rinse your body with warm water.  8. Do not shower/wash with your normal soap after using and rinsing off the CHG soap.  9. Pat yourself dry with a clean towel.  10. Wear clean pajamas to bed the night before surgery.  12. Place clean sheets on your bed the night of your first shower and do not sleep with pets.  13. Shower again with the CHG soap on the day of surgery prior to arriving at the hospital.  14. Do not apply any deodorants/lotions/powders.  15. Please wear clean clothes to the hospital.

## 2024-08-14 NOTE — H&P (Signed)
 HISTORY OF PRESENT ILLNESS (HPI):  53 y.o. male presented to [facility] ED on 07/14/24 for evaluation of generalized abdominal pain, nausea/vomiting and 1 episode of diarrhea that morning. States his symptoms started the day before.  Pain radiates to right upper quadrant pain. No known aggravating or alleviating factors.  Labs show known leukocytosis with a white blood count of 6.6, normal hemoglobin of 15.9.  Patient had normal LFTs, alka phos, and normal total bilirubin.  His lipase was slightly elevated at 55.  CT of abdomen showed mild dilation of the extrahepatic bile duct and subtle filling defect suspicious for intraductal debris or calculi.  No evidence of small bowel obstruction or ileus. MRCP was then ordered which was positive for choledocholithiasis.  GI was consulted.  ERCP was done yesterday by Dr. Jinny, successfully removing sludge and biliary sphincterotomy.   This morning patient is presenting with generalized abdominal pain, nausea, vomiting, and reports feeling distended.  States he is feeling more full than usual.  His last bowel movement was 2 days ago.  He admits to not being able to pass gas.  Pain medication seem to improve symptoms.  No known aggravating factors.  Patient denies any recent abdominal surgeries.  States he had an abscess in intestine in the 1990s.    This morning patient was afebrile and hypertensive with a BP of 145/76,  heart rate of 66 and RR of 18.  His labs post-ERCP show an elevated white blood count of 14.2 and normal hemoglobin of 14.9.  Rest of labs including LFTs, alkaline phosphatase, and total bilirubin are within normal limits. Abdominal x-ray has been ordered by GI.    Surgery is consulted physician Dr. Trudy in this context for evaluation and management of ileus vs small bowel obstruction.    PAST MEDICAL HISTORY (PMH):      Past Medical History:  Diagnosis Date   Hepatic steatosis     HIV (human immunodeficiency virus infection) (HCC)             PAST SURGICAL HISTORY (PSH):       Past Surgical History:  Procedure Laterality Date   ERCP N/A 07/15/2024    Procedure: ERCP, WITH INTERVENTION IF INDICATED;  Surgeon: Jinny Carmine, MD;  Location: [facility] ENDOSCOPY;  Service: Endoscopy;  Laterality: N/A;   ESOPHAGOGASTRODUODENOSCOPY   07/15/2024    Procedure: EGD (ESOPHAGOGASTRODUODENOSCOPY);  Surgeon: Jinny Carmine, MD;  Location: [facility] ENDOSCOPY;  Service: Endoscopy;;          MEDICATIONS:         Prior to Admission medications   Medication Sig Start Date End Date Taking? Authorizing Provider  BIKTARVY  50-200-25 MG TABS tablet Take 1 tablet by mouth daily.     Yes [provider]  omeprazole (PRILOSEC) 20 MG capsule Take 20 mg by mouth daily.     Yes [provider]  cyclobenzaprine  (FLEXERIL ) 10 MG tablet Take 1 tablet (10 mg total) by mouth 3 (three) times daily as needed. Patient not taking: Reported on 07/14/2024 04/22/24     Jacolyn Pae, MD  elvitegravir-cobicistat-emtricitabine -tenofovir  (STRIBILD) 150-150-200-300 MG TABS tablet Take 1 tablet by mouth daily with breakfast. Patient not taking: Reported on 07/14/2024 08/25/14     Efrain Lamar ORN, MD  ibuprofen  (ADVIL ) 600 MG tablet Take 1 tablet (600 mg total) by mouth every 6 (six) hours as needed. Patient not taking: Reported on 07/14/2024 04/22/24     Jacolyn Pae, MD  Imiquimod Pump 3.75 % CREA Apply 7.5 g topically daily. Patient  not taking: Reported on 07/14/2024 07/13/24     [provider]      ALLERGIES:  Allergies       Allergies  Allergen Reactions   Amoxicillin Dermatitis, Hives, Itching and Nausea Only      REACTION: hives        SOCIAL HISTORY:  Social History         Socioeconomic History   Marital status: Single      Spouse name: Not on file   Number of children: Not on file   Years of education: Not on file   Highest education level: Not on file  Occupational History   Not on file  Tobacco Use   Smoking status:  Every Day      Current packs/day: 0.50      Types: Cigarettes   Smokeless tobacco: Never   Tobacco comments:      e-cigarettes  Vaping Use   Vaping status: Never Used  Substance and Sexual Activity   Alcohol use: Yes      Comment: occassionally   Drug use: Yes      Types: Marijuana      Comment: daily use   Sexual activity: Yes      Partners: Male      Comment: pt declines condoms  Other Topics Concern   Not on file  Social History Narrative   Not on file    Social Drivers of Health        Financial Resource Strain: Not on file  Food Insecurity: No Food Insecurity (07/15/2024)    Hunger Vital Sign     Worried About Running Out of Food in the Last Year: Never true     Ran Out of Food in the Last Year: Never true  Transportation Needs: No Transportation Needs (07/15/2024)    PRAPARE - Therapist, art (Medical): No     Lack of Transportation (Non-Medical): No  Physical Activity: Not on file  Stress: Not on file  Social Connections: Not on file  Intimate Partner Violence: Not At Risk (07/15/2024)    Humiliation, Afraid, Rape, and Kick questionnaire     Fear of Current or Ex-Partner: No     Emotionally Abused: No     Physically Abused: No     Sexually Abused: No      FAMILY HISTORY:       Family History  Problem Relation Age of Onset   Heart disease Father     Diabetes Maternal Grandmother     Diabetes Paternal Grandmother     Heart disease Paternal Grandfather              REVIEW OF SYSTEMS:  Review of Systems  Constitutional:  Negative for chills and fever.  Respiratory:  Negative for shortness of breath and wheezing.   Cardiovascular:  Negative for chest pain and palpitations.  Gastrointestinal:  Positive for abdominal pain, nausea and vomiting.     VITAL SIGNS:  Temp:  [96.4 F (35.8 C)-98.4 F (36.9 C)] 97.8 F (36.6 C) (07/24 0839) Pulse Rate:  [66-85] 66 (07/24 0839) Resp:  [15-20] 18 (07/24 0839) BP: (92-145)/(61-93)  145/76 (07/24 0839) SpO2:  [90 %-98 %] 95 % (07/24 0839) Weight:  [76.4 kg] 76.4 kg (07/23 1347)     Height: 5' 5 (165.1 cm) Weight: 76.4 kg BMI (Calculated): 28.03    INTAKE/OUTPUT:  07/23 0701 - 07/24 0700 In: 240 [P.O.:240] Out: -    PHYSICAL EXAM:  Physical Exam Constitutional:      Appearance: He is well-developed.  HENT:     Head: Normocephalic and atraumatic.  Cardiovascular:     Rate and Rhythm: Normal rate and regular rhythm.  Pulmonary:     Effort: Pulmonary effort is normal.     Breath sounds: Normal breath sounds.  Abdominal:     General: There is distension.     Palpations: Abdomen is soft.     Tenderness: There is generalized abdominal tenderness.  Neurological:     Mental Status: He is alert.       Labs:      Latest Ref Rng & Units 07/16/2024    3:46 AM 07/15/2024    3:36 AM 07/14/2024    9:23 AM  CBC  WBC 4.0 - 10.5 K/uL 14.2  5.3  6.6   Hemoglobin 13.0 - 17.0 g/dL 85.0  85.7  84.0   Hematocrit 39.0 - 52.0 % 43.1  42.0  47.4   Platelets 150 - 400 K/uL 218  198  228         Latest Ref Rng & Units 07/16/2024    3:46 AM 07/15/2024    3:36 AM 07/14/2024    9:23 AM  CMP  Glucose 70 - 99 mg/dL 877  893  881   BUN 6 - 20 mg/dL 6  6  8    Creatinine 0.61 - 1.24 mg/dL 9.29  9.23  9.16   Sodium 135 - 145 mmol/L 138  137  140   Potassium 3.5 - 5.1 mmol/L 3.9  3.6  3.4   Chloride 98 - 111 mmol/L 104  107  106   CO2 22 - 32 mmol/L 25  25  23    Calcium 8.9 - 10.3 mg/dL 9.0  8.5  9.3   Total Protein 6.5 - 8.1 g/dL 6.7    7.3   Total Bilirubin 0.0 - 1.2 mg/dL 1.1    0.6   Alkaline Phos 38 - 126 U/L 65    76   AST 15 - 41 U/L 40    20   ALT 0 - 44 U/L 26    16       Imaging studies:  CLINICAL DATA:  Acute nonlocalized abdominal pain   EXAM: 07/14/24 CT ABDOMEN AND PELVIS WITH CONTRAST   TECHNIQUE: Multidetector CT imaging of the abdomen and pelvis was performed using the standard protocol following bolus administration of intravenous contrast.    RADIATION DOSE REDUCTION: This exam was performed according to the departmental dose-optimization program which includes automated exposure control, adjustment of the mA and/or kV according to patient size and/or use of iterative reconstruction technique.   CONTRAST:  OMNIPAQUE  IOHEXOL  300 MG/ML  SOLN   COMPARISON:  09/16/2017   FINDINGS: Lower chest: No acute abnormality.  Small hiatal hernia   Hepatobiliary: Mild hepatic steatosis. No enhancing intrahepatic mass. The extrahepatic bile duct is mildly dilated measuring up to 10 mm in diameter and there is a a subtle filling defect within the distal duct at the level of the into a, best seen on image # 38/2 and 44/5, suspicious for intraductal debris or calculi. No intrahepatic biliary ductal dilation. Gallbladder unremarkable.   Pancreas: Unremarkable   Spleen: Unremarkable   Adrenals/Urinary Tract: Adrenal glands are unremarkable. Kidneys are normal, without renal calculi, focal lesion, or hydronephrosis. Bladder is unremarkable.   Stomach/Bowel: Stomach is within normal limits. Appendix appears normal. No evidence of bowel wall thickening, distention, or inflammatory changes. No free  intraperitoneal gas or fluid   Vascular/Lymphatic: Aortic atherosclerosis. No enlarged abdominal or pelvic lymph nodes.   Reproductive: Prostate is unremarkable.   Other: None significant   Musculoskeletal: No acute or significant osseous findings.   IMPRESSION: 1. Mild dilation of the extrahepatic bile duct measuring up to 10 mm in diameter with a subtle filling defect within the distal duct at the level of the ampulla, suspicious for intraductal debris or calculi. Correlate with liver enzymes and consider further evaluation with MRCP. 2. Mild hepatic steatosis. 3. Small hiatal hernia.     Electronically Signed   By: Dorethia Molt M.D.   On: 07/14/2024 11:57   CLINICAL DATA:  Right upper quadrant abdominal pain nausea  and vomiting   EXAM: 07/14/24 MRI ABDOMEN WITHOUT AND WITH CONTRAST (INCLUDING MRCP)   TECHNIQUE: Multiplanar multisequence MR imaging of the abdomen was performed both before and after the administration of intravenous contrast. Heavily T2-weighted images of the biliary and pancreatic ducts were obtained, and three-dimensional MRCP images were rendered by post processing.   CONTRAST:  7mL GADAVIST  GADOBUTROL  1 MMOL/ML IV SOLN   COMPARISON:  CT scan 07/14/2024   FINDINGS: Lower chest: Unremarkable   Hepatobiliary: Mild hepatic steatosis. No gallbladder wall thickening.   Common bile duct mildly dilated at 0.8 cm in diameter with a truncated distal margin and meniscus appearance as on image 74 series 14 favoring distal choledocholithiasis. No significant wall enhancement in the distal CBD. No abnormal enhancing lesion in the pancreatic head along the distal common bile duct. No significant intrahepatic biliary dilatation.   The gallbladder appears unremarkable.  No pericholecystic fluid.   Pancreas:  Partial pancreas divisum.   Spleen:  Unremarkable   Adrenals/Urinary Tract:  Unremarkable   Stomach/Bowel: Transverse duodenal diverticulum.   Vascular/Lymphatic:  Abdominal aortic atherosclerosis.   Other:  No supplemental non-categorized findings.   Musculoskeletal: Unremarkable   IMPRESSION: 1. Mild dilatation of the common bile duct at 0.8 cm in diameter with a truncated distal margin and meniscus appearance favoring distal choledocholithiasis. No significant wall enhancement in the distal CBD. No abnormal enhancing lesion in the pancreatic head along the distal common bile duct. 2. Mild hepatic steatosis. 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 5.  Aortic Atherosclerosis (ICD10-I70.0).     Electronically Signed   By: Ryan Salvage M.D.   On: 07/14/2024 14:32     Assessment/Plan: 53 y.o. male with ileus vs small bowel  obstruction post-ERCP for choledocholithiasis, complicated by pertinent comorbidities including HIV.               - Stable vital signs             - Awaiting on abdominal x-ray report from this morning. Imaging shows dilatation of bowel             - Manage conservatively for now with NGT, NPO and IV fluids. Plan for small bowel protocol and will follow progress with x-rays              - Discussed the recommendation of cholecystectomy to prevent future recurrence of choledocholithiasis or other complications.  Discussed robotic-assisted laparoscopic cholecystectomy.  Will discuss further once patient improves clinically and is less distended             - Pain management and DVT prophylaxis    Thank you for the opportunity to participate in this patient's care.

## 2024-08-21 ENCOUNTER — Ambulatory Visit: Payer: MEDICAID | Admitting: Urgent Care

## 2024-08-21 ENCOUNTER — Encounter
Admission: RE | Disposition: A | Discharge status: Home / Self Care | Payer: Self-pay | Source: Home / Self Care | Attending: Surgery

## 2024-08-21 ENCOUNTER — Other Ambulatory Visit: Payer: Self-pay

## 2024-08-21 ENCOUNTER — Ambulatory Visit
Admission: RE | Admit: 2024-08-21 | Discharge: 2024-08-21 | Disposition: A | Discharge status: Home / Self Care | Payer: MEDICAID | Attending: Surgery | Admitting: Surgery

## 2024-08-21 ENCOUNTER — Encounter: Payer: Self-pay | Admitting: Surgery

## 2024-08-21 DIAGNOSIS — K8064 Calculus of gallbladder and bile duct with chronic cholecystitis without obstruction: Secondary | ICD-10-CM | POA: Insufficient documentation

## 2024-08-21 DIAGNOSIS — K219 Gastro-esophageal reflux disease without esophagitis: Secondary | ICD-10-CM | POA: Diagnosis not present

## 2024-08-21 DIAGNOSIS — Z21 Asymptomatic human immunodeficiency virus [HIV] infection status: Secondary | ICD-10-CM | POA: Diagnosis not present

## 2024-08-21 DIAGNOSIS — F17210 Nicotine dependence, cigarettes, uncomplicated: Secondary | ICD-10-CM | POA: Insufficient documentation

## 2024-08-21 DIAGNOSIS — Z79899 Other long term (current) drug therapy: Secondary | ICD-10-CM | POA: Diagnosis not present

## 2024-08-21 DIAGNOSIS — K819 Cholecystitis, unspecified: Secondary | ICD-10-CM

## 2024-08-21 SURGERY — CHOLECYSTECTOMY, ROBOT-ASSISTED, LAPAROSCOPIC
Anesthesia: General | Site: Abdomen

## 2024-08-21 MED ORDER — DEXAMETHASONE SODIUM PHOSPHATE 10 MG/ML IJ SOLN
INTRAMUSCULAR | Status: AC
  Filled 2024-08-21: qty 1

## 2024-08-21 MED ORDER — LACTATED RINGERS IV SOLN: INTRAVENOUS | Status: DC

## 2024-08-21 MED ORDER — CEFAZOLIN SODIUM-DEXTROSE 2-4 GM/100ML-% IV SOLN
INTRAVENOUS | Status: AC
  Filled 2024-08-21: qty 100

## 2024-08-21 MED ORDER — ROCURONIUM BROMIDE 100 MG/10ML IV SOLN
INTRAVENOUS | Status: DC | PRN
  Administered 2024-08-21: 20 mg via INTRAVENOUS
  Administered 2024-08-21: 50 mg via INTRAVENOUS
  Administered 2024-08-21: 30 mg via INTRAVENOUS

## 2024-08-21 MED ORDER — MIDAZOLAM HCL 2 MG/2ML IJ SOLN
INTRAMUSCULAR | Status: DC | PRN
  Administered 2024-08-21: 2 mg via INTRAVENOUS

## 2024-08-21 MED ORDER — PROPOFOL 10 MG/ML IV BOLUS
INTRAVENOUS | Status: AC
  Filled 2024-08-21: qty 20

## 2024-08-21 MED ORDER — CHLORHEXIDINE GLUCONATE 0.12 % MT SOLN
OROMUCOSAL | Status: AC
  Filled 2024-08-21: qty 15

## 2024-08-21 MED ORDER — MIDAZOLAM HCL 2 MG/2ML IJ SOLN
INTRAMUSCULAR | Status: AC
  Filled 2024-08-21: qty 2

## 2024-08-21 MED ORDER — SUGAMMADEX SODIUM 200 MG/2ML IV SOLN
INTRAVENOUS | Status: DC | PRN
  Administered 2024-08-21: 300 mg via INTRAVENOUS

## 2024-08-21 MED ORDER — CEFAZOLIN SODIUM-DEXTROSE 2-4 GM/100ML-% IV SOLN
2.0000 g | INTRAVENOUS | Status: AC
  Administered 2024-08-21: 2 g via INTRAVENOUS

## 2024-08-21 MED ORDER — DOCUSATE SODIUM 100 MG PO CAPS
100.0000 mg | ORAL_CAPSULE | Freq: Two times a day (BID) | ORAL | 0 refills | Status: AC | PRN
  Filled 2024-08-21: qty 20, 10d supply, fill #0

## 2024-08-21 MED ORDER — LIDOCAINE-EPINEPHRINE (PF) 1 %-1:200000 IJ SOLN
INTRAMUSCULAR | Status: AC
  Filled 2024-08-21: qty 30

## 2024-08-21 MED ORDER — LIDOCAINE-EPINEPHRINE (PF) 1 %-1:200000 IJ SOLN
INTRAMUSCULAR | Status: DC | PRN
  Administered 2024-08-21: 28 mL

## 2024-08-21 MED ORDER — 0.9 % SODIUM CHLORIDE (POUR BTL) OPTIME
TOPICAL | Status: DC | PRN
  Administered 2024-08-21: 1000 mL

## 2024-08-21 MED ORDER — HYDROMORPHONE HCL 1 MG/ML IJ SOLN
INTRAMUSCULAR | Status: AC
  Filled 2024-08-21: qty 1

## 2024-08-21 MED ORDER — ACETAMINOPHEN 325 MG PO TABS
650.0000 mg | ORAL_TABLET | Freq: Three times a day (TID) | ORAL | 0 refills | Status: AC | PRN
  Filled 2024-08-21: qty 40, 7d supply, fill #0

## 2024-08-21 MED ORDER — OXYCODONE HCL 5 MG PO TABS
ORAL_TABLET | ORAL | Status: AC
Start: 2024-08-21 — End: 2024-08-21
  Filled 2024-08-21: qty 1

## 2024-08-21 MED ORDER — DEXAMETHASONE SODIUM PHOSPHATE 10 MG/ML IJ SOLN
INTRAMUSCULAR | Status: DC | PRN
  Administered 2024-08-21: 5 mg via INTRAVENOUS

## 2024-08-21 MED ORDER — ROCURONIUM BROMIDE 10 MG/ML (PF) SYRINGE
PREFILLED_SYRINGE | INTRAVENOUS | Status: AC
Start: 2024-08-21 — End: 2024-08-21
  Filled 2024-08-21: qty 10

## 2024-08-21 MED ORDER — PROPOFOL 10 MG/ML IV BOLUS
INTRAVENOUS | Status: DC | PRN
  Administered 2024-08-21: 130 mg via INTRAVENOUS

## 2024-08-21 MED ORDER — LIDOCAINE HCL (CARDIAC) PF 100 MG/5ML IV SOSY
PREFILLED_SYRINGE | INTRAVENOUS | Status: DC | PRN
Start: 2024-08-21 — End: 2024-08-21
  Administered 2024-08-21: 80 mg via INTRAVENOUS

## 2024-08-21 MED ORDER — FENTANYL CITRATE (PF) 100 MCG/2ML IJ SOLN
INTRAMUSCULAR | Status: AC
  Filled 2024-08-21: qty 2

## 2024-08-21 MED ORDER — INDOCYANINE GREEN 25 MG IV SOLR
INTRAVENOUS | Status: AC
  Filled 2024-08-21: qty 10

## 2024-08-21 MED ORDER — ONDANSETRON HCL 4 MG/2ML IJ SOLN
INTRAMUSCULAR | Status: DC | PRN
  Administered 2024-08-21: 4 mg via INTRAVENOUS

## 2024-08-21 MED ORDER — CHLORHEXIDINE GLUCONATE CLOTH 2 % EX PADS
6.0000 | MEDICATED_PAD | Freq: Once | CUTANEOUS | Status: AC
  Administered 2024-08-21: 6 via TOPICAL

## 2024-08-21 MED ORDER — DEXMEDETOMIDINE HCL IN NACL 80 MCG/20ML IV SOLN
INTRAVENOUS | Status: AC
  Filled 2024-08-21: qty 20

## 2024-08-21 MED ORDER — LACTATED RINGERS IV SOLN: INTRAVENOUS | Status: DC | PRN

## 2024-08-21 MED ORDER — ORAL CARE MOUTH RINSE: 15.0000 mL | Freq: Once | OROMUCOSAL | Status: AC

## 2024-08-21 MED ORDER — INDOCYANINE GREEN 25 MG IV SOLR
1.2500 mg | Freq: Once | INTRAVENOUS | Status: AC
  Administered 2024-08-21: 1.25 mg via INTRAVENOUS

## 2024-08-21 MED ORDER — OXYCODONE HCL 5 MG PO TABS
5.0000 mg | ORAL_TABLET | Freq: Once | ORAL | Status: AC | PRN
  Administered 2024-08-21: 5 mg via ORAL

## 2024-08-21 MED ORDER — HYDROMORPHONE HCL 1 MG/ML IJ SOLN
INTRAMUSCULAR | Status: DC | PRN
  Administered 2024-08-21: .5 mg via INTRAVENOUS

## 2024-08-21 MED ORDER — DEXMEDETOMIDINE HCL IN NACL 80 MCG/20ML IV SOLN
INTRAVENOUS | Status: DC | PRN
  Administered 2024-08-21 (×3): 4 ug via INTRAVENOUS
  Administered 2024-08-21: 8 ug via INTRAVENOUS

## 2024-08-21 MED ORDER — KETOROLAC TROMETHAMINE 30 MG/ML IJ SOLN
INTRAMUSCULAR | Status: DC | PRN
  Administered 2024-08-21: 15 mg via INTRAVENOUS

## 2024-08-21 MED ORDER — CHLORHEXIDINE GLUCONATE 0.12 % MT SOLN
15.0000 mL | Freq: Once | OROMUCOSAL | Status: AC
  Administered 2024-08-21: 15 mL via OROMUCOSAL

## 2024-08-21 MED ORDER — HYDROMORPHONE HCL 1 MG/ML IJ SOLN
0.2500 mg | INTRAMUSCULAR | Status: DC | PRN
  Administered 2024-08-21: 0.5 mg via INTRAVENOUS

## 2024-08-21 MED ORDER — OXYCODONE-ACETAMINOPHEN 5-325 MG PO TABS
1.0000 | ORAL_TABLET | Freq: Three times a day (TID) | ORAL | 0 refills | Status: AC | PRN
  Filled 2024-08-21: qty 6, 2d supply, fill #0

## 2024-08-21 MED ORDER — KETOROLAC TROMETHAMINE 30 MG/ML IJ SOLN
INTRAMUSCULAR | Status: AC
  Filled 2024-08-21: qty 1

## 2024-08-21 MED ORDER — OXYCODONE HCL 5 MG/5ML PO SOLN: 5.0000 mg | Freq: Once | ORAL | Status: AC | PRN

## 2024-08-21 MED ORDER — FENTANYL CITRATE (PF) 100 MCG/2ML IJ SOLN
INTRAMUSCULAR | Status: DC | PRN
  Administered 2024-08-21: 50 ug via INTRAVENOUS
  Administered 2024-08-21 (×2): 25 ug via INTRAVENOUS

## 2024-08-21 MED ORDER — LIDOCAINE HCL (PF) 2 % IJ SOLN
INTRAMUSCULAR | Status: AC
Start: 2024-08-21 — End: 2024-08-21
  Filled 2024-08-21: qty 5

## 2024-08-21 MED ORDER — BUPIVACAINE HCL (PF) 0.5 % IJ SOLN
INTRAMUSCULAR | Status: AC
  Filled 2024-08-21: qty 30

## 2024-08-21 MED ORDER — ONDANSETRON HCL 4 MG/2ML IJ SOLN
INTRAMUSCULAR | Status: AC
  Filled 2024-08-21: qty 2

## 2024-08-21 SURGICAL SUPPLY — 42 items
ANCHOR TIS RET SYS 235ML (MISCELLANEOUS) ×2 IMPLANT
BAG PRESSURE INF REUSE 1000 (BAG) IMPLANT
CATH REDDICK CHOLANGI 4FR 50CM (CATHETERS) IMPLANT
CAUTERY HOOK MNPLR 1.6 DVNC XI (INSTRUMENTS) ×2 IMPLANT
CLIP LIGATING HEMO O LOK GREEN (MISCELLANEOUS) ×2 IMPLANT
DEFOGGER SCOPE WARM SEASHARP (MISCELLANEOUS) ×2 IMPLANT
DERMABOND ADVANCED .7 DNX12 (GAUZE/BANDAGES/DRESSINGS) ×2 IMPLANT
DRAPE ARM DVNC X/XI (DISPOSABLE) ×8 IMPLANT
DRAPE C-ARM XRAY 36X54 (DRAPES) IMPLANT
DRAPE COLUMN DVNC XI (DISPOSABLE) ×2 IMPLANT
DRIVER NDL MEGA SUTCUT DVNCXI (INSTRUMENTS) IMPLANT
DRIVER NDLE MEGA SUTCUT DVNCXI (INSTRUMENTS) ×1 IMPLANT
ELECTRODE REM PT RTRN 9FT ADLT (ELECTROSURGICAL) ×2 IMPLANT
FORCEPS BPLR FENES DVNC XI (FORCEP) ×2 IMPLANT
FORCEPS PROGRASP DVNC XI (FORCEP) ×2 IMPLANT
GLOVE BIOGEL PI IND STRL 7.0 (GLOVE) ×4 IMPLANT
GLOVE SURG SYN 6.5 PF PI (GLOVE) ×8 IMPLANT
GOWN STRL REUS W/ TWL LRG LVL3 (GOWN DISPOSABLE) ×8 IMPLANT
GRASPER SUT TROCAR 14GX15 (MISCELLANEOUS) IMPLANT
IRRIGATOR SUCT 8 DISP DVNC XI (IRRIGATION / IRRIGATOR) IMPLANT
IV NS 1000ML BAXH (IV SOLUTION) IMPLANT
KIT TURNOVER KIT A (KITS) ×2 IMPLANT
LABEL OR SOLS (LABEL) ×2 IMPLANT
MANIFOLD NEPTUNE II (INSTRUMENTS) ×2 IMPLANT
NDL HYPO 22X1.5 SAFETY MO (MISCELLANEOUS) ×2 IMPLANT
NDL INSUFFLATION 14GA 120MM (NEEDLE) ×2 IMPLANT
NEEDLE HYPO 22X1.5 SAFETY MO (MISCELLANEOUS) ×1 IMPLANT
NEEDLE INSUFFLATION 14GA 120MM (NEEDLE) ×1 IMPLANT
NS IRRIG 500ML POUR BTL (IV SOLUTION) ×2 IMPLANT
OBTURATOR OPTICALSTD 8 DVNC (TROCAR) ×2 IMPLANT
PACK LAP CHOLECYSTECTOMY (MISCELLANEOUS) ×2 IMPLANT
SEAL UNIV 5-12 XI (MISCELLANEOUS) ×8 IMPLANT
SET TUBE SMOKE EVAC HIGH FLOW (TUBING) ×2 IMPLANT
SOLUTION ELECTROSURG ANTI STCK (MISCELLANEOUS) ×2 IMPLANT
SPIKE FLUID TRANSFER (MISCELLANEOUS) ×4 IMPLANT
SUT SILK 3 0 SH 30 (SUTURE) IMPLANT
SUT VICRYL 0 UR6 27IN ABS (SUTURE) ×2 IMPLANT
SUTURE MNCRL 4-0 27XMF (SUTURE) ×4 IMPLANT
SYR 30ML LL (SYRINGE) IMPLANT
SYSTEM WECK SHIELD CLOSURE (TROCAR) IMPLANT
TRAP FLUID SMOKE EVACUATOR (MISCELLANEOUS) ×2 IMPLANT
WATER STERILE IRR 500ML POUR (IV SOLUTION) ×2 IMPLANT

## 2024-08-21 NOTE — Anesthesia Postprocedure Evaluation (Signed)
 Anesthesia Post Note  Patient: Timothy Salas.  Procedure(s) Performed: CHOLECYSTECTOMY, ROBOT-ASSISTED, LAPAROSCOPIC (Abdomen)  Patient location during evaluation: PACU Anesthesia Type: General Level of consciousness: awake and alert Pain management: pain level controlled Vital Signs Assessment: post-procedure vital signs reviewed and stable Respiratory status: spontaneous breathing, nonlabored ventilation, respiratory function stable and patient connected to nasal cannula oxygen Cardiovascular status: blood pressure returned to baseline and stable Postop Assessment: no apparent nausea or vomiting Anesthetic complications: no   No notable events documented.   Last Vitals:  Vitals:   08/21/24 1045 08/21/24 1053  BP: 118/70   Pulse: 87 96  Resp: 20 (!) 26  Temp:    SpO2: 95% 97%    Last Pain:  Vitals:   08/21/24 1053  PainSc: 6                  Lendia LITTIE Mae

## 2024-08-21 NOTE — Discharge Instructions (Addendum)
 Laparoscopic Cholecystectomy, Care After This sheet gives you information about how to care for yourself after your procedure. Your doctor may also give you more specific instructions. If you have problems or questions, contact your doctor. Follow these instructions at home: Care for cuts from surgery (incisions)  Follow instructions from your doctor about how to take care of your cuts from surgery. Make sure you: Wash your hands with soap and water before you change your bandage (dressing). If you cannot use soap and water, use hand sanitizer. Change your bandage as told by your doctor. Leave stitches (sutures), skin glue, or skin tape (adhesive) strips in place. They may need to stay in place for 2 weeks or longer. If tape strips get loose and curl up, you may trim the loose edges. Do not remove tape strips completely unless your doctor says it is okay. Do not take baths, swim, or use a hot tub until your doctor says it is okay. OK TO SHOWER 24HRS AFTER YOUR SURGERY.  Check your surgical cut area every day for signs of infection. Check for: More redness, swelling, or pain. More fluid or blood. Warmth. Pus or a bad smell. Activity Do not drive or use heavy machinery while taking prescription pain medicine. Do not play contact sports until your doctor says it is okay. Do not drive for 24 hours if you were given a medicine to help you relax (sedative). Rest as needed. Do not return to work or school until your doctor says it is okay. General instructions CLEAR LIQUID DIET FOR 24HRS AFTER PROCEDURE. THEN IF NO WORSENING PAIN, OK TO ADVANCE TO REGULAR DIET  tylenol  as needed for discomfort.   Use narcotics, if prescribed, only when tylenol  is not enough to control pain.  325-650mg  every 8hrs to max of 3000mg /24hrs (including the 325mg  in every norco dose) for the tylenol .   To prevent or treat constipation while you are taking prescription pain medicine, your doctor may recommend that  you: Drink enough fluid to keep your pee (urine) clear or pale yellow. Take over-the-counter or prescription medicines. Eat foods that are high in fiber, such as fresh fruits and vegetables, whole grains, and beans. Limit foods that are high in fat and processed sugars, such as fried and sweet foods. Contact a doctor if: You develop a rash. You have more redness, swelling, or pain around your surgical cuts. You have more fluid or blood coming from your surgical cuts. Your surgical cuts feel warm to the touch. You have pus or a bad smell coming from your surgical cuts. You have a fever. One or more of your surgical cuts breaks open. You have trouble breathing. You have chest pain. You have pain that is getting worse in your shoulders. You faint or feel dizzy when you stand. You have very bad pain in your belly (abdomen). You are sick to your stomach (nauseous) for more than one day. You have throwing up (vomiting) that lasts for more than one day. You have leg pain. This information is not intended to replace advice given to you by your health care provider. Make sure you discuss any questions you have with your health care provider. Document Released: 09/18/2008 Document Revised: 06/30/2016 Document Reviewed: 05/28/2016 Elsevier Interactive Patient Education  2019 [employer].

## 2024-08-21 NOTE — Transfer of Care (Signed)
 Immediate Anesthesia Transfer of Care Note  Patient: Timothy Salas.  Procedure(s) Performed: CHOLECYSTECTOMY, ROBOT-ASSISTED, LAPAROSCOPIC (Abdomen)  Patient Location: PACU  Anesthesia Type:General  Level of Consciousness: awake and oriented  Airway & Oxygen Therapy: Patient Spontanous Breathing and Patient connected to face mask oxygen  Post-op Assessment: Report given to RN and Post -op Vital signs reviewed and stable  Post vital signs: Reviewed and stable  Last Vitals:  Vitals Value Taken Time  BP 126/65 08/21/24 10:15  Temp 35.9 C 08/21/24 10:09  Pulse 88 08/21/24 10:20  Resp 20 08/21/24 10:20  SpO2 98 % 08/21/24 10:20  Vitals shown include unfiled device data.  Last Pain: There were no vitals filed for this visit.       Complications: No notable events documented.

## 2024-08-21 NOTE — Anesthesia Preprocedure Evaluation (Addendum)
 Anesthesia Evaluation  Patient identified by MRN, date of birth, ID band Patient awake    Reviewed: Allergy & Precautions, NPO status , Patient's Chart, lab work & pertinent test results  History of Anesthesia Complications Negative for: history of anesthetic complications  Airway Mallampati: II  TM Distance: >3 FB Neck ROM: full    Dental  (+) Partial Lower, Upper Dentures   Pulmonary Current Smoker and Patient abstained from smoking.   Pulmonary exam normal        Cardiovascular negative cardio ROS Normal cardiovascular exam     Neuro/Psych  PSYCHIATRIC DISORDERS Anxiety     negative neurological ROS     GI/Hepatic Neg liver ROS,GERD  Medicated,,  Endo/Other  negative endocrine ROS    Renal/GU negative Renal ROS  negative genitourinary   Musculoskeletal   Abdominal   Peds  Hematology  (+) HIV  Anesthesia Other Findings Past Medical History: No date: Anxiety 2025: Common bile duct (CBD) obstruction No date: Complication of anesthesia     Comment:  patient reports that he stopped breathing during               procedure on 07/15/24. No date: GERD (gastroesophageal reflux disease) No date: Hepatic steatosis No date: History of kidney stones No date: HIV (human immunodeficiency virus infection) (HCC) No date: Pneumonia  Past Surgical History: 07/15/2024: ERCP; N/A     Comment:  Procedure: ERCP, WITH INTERVENTION IF INDICATED;                Surgeon: Jinny Carmine, MD;  Location: [facility] ENDOSCOPY;                Service: Endoscopy;  Laterality: N/A; 07/15/2024: ESOPHAGOGASTRODUODENOSCOPY     Comment:  Procedure: EGD (ESOPHAGOGASTRODUODENOSCOPY);  Surgeon:               Jinny Carmine, MD;  Location: [facility] ENDOSCOPY;  Service:               Endoscopy;;     Reproductive/Obstetrics negative OB ROS                              Anesthesia Physical Anesthesia Plan  ASA: 2  Anesthesia  Plan: General ETT   Post-op Pain Management: Toradol  IV (intra-op)*, Ofirmev  IV (intra-op)*, Dilaudid  IV and Ketamine IV*   Induction: Intravenous  PONV Risk Score and Plan: 2 and Ondansetron , Dexamethasone , Midazolam  and Treatment may vary due to age or medical condition  Airway Management Planned: Oral ETT  Additional Equipment:   Intra-op Plan:   Post-operative Plan: Extubation in OR  Informed Consent: I have reviewed the patients History and Physical, chart, labs and discussed the procedure including the risks, benefits and alternatives for the proposed anesthesia with the patient or authorized representative who has indicated his/her understanding and acceptance.     Dental Advisory Given  Plan Discussed with: Anesthesiologist, CRNA and Surgeon  Anesthesia Plan Comments: (Patient consented for risks of anesthesia including but not limited to:  - adverse reactions to medications - damage to eyes, teeth, lips or other oral mucosa - nerve damage due to positioning  - sore throat or hoarseness - Damage to heart, brain, nerves, lungs, other parts of body or loss of life  Patient voiced understanding and assent.)         Anesthesia Quick Evaluation

## 2024-08-21 NOTE — Op Note (Signed)
 Preoperative diagnosis:  chronic and cholecystitis  Postoperative diagnosis: same as above  Procedure: Robotic assisted Laparoscopic Cholecystectomy, repair of colon injury  Anesthesia: GETA   Surgeon: Henriette Pierre  Specimen: Gallbladder  Complications: None  EBL: 15mL  Wound Classification: Clean Contaminated  Indications: see HPI  Findings: Critical view of safety noted Cystic duct and artery identified, ligated and divided, clips remained intact at end of procedure Adequate hemostasis  Small colon injury on there is needle repaired primarily with sutures.  Description of procedure:  The patient was placed on the operating table in the supine position. SCDs placed, pre-op abx administered.  General anesthesia was induced and OG tube placed by anesthesia. A time-out was completed verifying correct patient, procedure, site, positioning, and implant(s) and/or special equipment prior to beginning this procedure. The abdomen was prepped and draped in the usual sterile fashion.    Veress needle was placed at the Palmer's point and insufflation was started after confirming a positive saline drop test and no immediate increase in abdominal pressure.  After reaching 15 mm, the Veress needle was removed and a 8 mm port was placed via optiview technique under umbilicus measured 20mm from gallbladder.  The abdomen was inspected and there was noted to be some bleeding at the Veress needle insertion site as well as some bleeding on the omental aspect.  No abnormalities or injuries were found.  Under direct vision, ports were placed in the following locations: One 12 mm patient left of the umbilicus, 8cm from the periumbilical port, one 8 mm port placed to the patient right of the umbilical port 8 cm apart.  1 additional 8 mm port placed lateral to the 12mm port.  Once ports were placed, The table was placed in the reverse Trendelenburg position with the right side up. The Xi platform was brought  into the operative field and docked to the ports successfully.  Reinspection of the area where Veress needle was initially inserted noted a pinpoint area of bleeding on the colon serosa, likely from the Veress needle.  This was repaired by oversewing with 3-0 silk x 1.  No further bleeding was noted and no additional injuries to the serosa visible in the area.  Omental overlying the colon also did not have any additional areas of bleeding.  The dome of the gallbladder was grasped with prograsp, passed and retracted over the dome of the liver. Adhesions between the gallbladder and omentum, duodenum and transverse colon were lysed via hook cautery. The infundibulum was grasped with the fenestrated grasper and retracted toward the right lower quadrant. This maneuver exposed Calot's triangle. The peritoneum overlying the gallbladder infundibulum was then dissected  and the cystic duct and cystic artery identified.  Critical view of safety with the liver bed clearly visible behind the duct and artery with no additional structures noted.  The cystic duct and cystic artery clipped and divided close to the gallbladder.     The gallbladder was then dissected from its peritoneal and liver bed attachments by electrocautery. Hemostasis was checked and final inspection of the area of colon injury also noted to have adequate hemostasis and no sign of leakage.  Prior to removing the hook cautery and the Endo Catch bag was then placed through the 12 mm port and the gallbladder was removed.  The gallbladder was passed off the table as a specimen. There was no evidence of bleeding from the gallbladder fossa or cystic artery or leakage of the bile from the cystic duct stump.  The 12 mm port site closed with PMI using 0 vicryl under direct vision.  Abdomen desufflated and secondary trocars were removed under direct vision. No bleeding was noted. All skin incisions then closed with subcuticular sutures of 4-0 monocryl and dressed  with topical skin adhesive. The orogastric tube was removed and patient extubated.  The patient tolerated the procedure well and was taken to the postanesthesia care unit in stable condition.  All sponge and instrument count correct at end of procedure.

## 2024-08-21 NOTE — Interval H&P Note (Signed)
 No change. OK to proceed.

## 2024-08-21 NOTE — Anesthesia Procedure Notes (Signed)
 Procedure Name: Intubation Date/Time: 08/21/2024 8:49 AM  Performed by: Dyane Mass, CRNAPre-anesthesia Checklist: Patient identified, Emergency Drugs available, Suction available and Patient being monitored Patient Re-evaluated:Patient Re-evaluated prior to induction Oxygen Delivery Method: Circle system utilized Preoxygenation: Pre-oxygenation with 100% oxygen Induction Type: IV induction Ventilation: Mask ventilation without difficulty Laryngoscope Size: McGrath and 4 Grade View: Grade I Tube type: Oral Tube size: 7.5 mm Number of attempts: 1 Airway Equipment and Method: Stylet and Oral airway Placement Confirmation: ETT inserted through vocal cords under direct vision, positive ETCO2 and breath sounds checked- equal and bilateral Secured at: 21 cm Tube secured with: Tape Dental Injury: Teeth and Oropharynx as per pre-operative assessment

## 2024-08-25 LAB — SURGICAL PATHOLOGY

## 2024-09-02 ENCOUNTER — Other Ambulatory Visit: Payer: Self-pay | Admitting: Surgery

## 2024-09-02 DIAGNOSIS — K810 Acute cholecystitis: Secondary | ICD-10-CM

## 2024-09-02 DIAGNOSIS — K651 Peritoneal abscess: Secondary | ICD-10-CM

## 2024-09-03 ENCOUNTER — Ambulatory Visit
Admission: RE | Admit: 2024-09-03 | Discharge: 2024-09-03 | Disposition: A | Discharge status: Home / Self Care | Payer: Self-pay | Source: Ambulatory Visit | Attending: Surgery | Admitting: Surgery

## 2024-09-03 DIAGNOSIS — K651 Peritoneal abscess: Secondary | ICD-10-CM

## 2024-09-03 DIAGNOSIS — K810 Acute cholecystitis: Secondary | ICD-10-CM

## 2024-10-16 ENCOUNTER — Other Ambulatory Visit: Payer: Self-pay | Admitting: Nurse Practitioner

## 2024-10-16 DIAGNOSIS — N5089 Other specified disorders of the male genital organs: Secondary | ICD-10-CM

## 2024-10-20 ENCOUNTER — Ambulatory Visit
Admission: RE | Admit: 2024-10-20 | Discharge: 2024-10-20 | Disposition: A | Discharge status: Home / Self Care | Source: Ambulatory Visit | Attending: Nurse Practitioner | Admitting: Nurse Practitioner

## 2024-10-20 DIAGNOSIS — N5089 Other specified disorders of the male genital organs: Secondary | ICD-10-CM | POA: Diagnosis present

## 2024-11-24 ENCOUNTER — Other Ambulatory Visit: Payer: Self-pay | Admitting: Nurse Practitioner

## 2024-11-24 DIAGNOSIS — N6324 Unspecified lump in the left breast, lower inner quadrant: Secondary | ICD-10-CM

## 2024-12-07 ENCOUNTER — Inpatient Hospital Stay: Admission: RE | Admit: 2024-12-07 | Discharge: 2024-12-07 | Attending: Nurse Practitioner

## 2024-12-07 DIAGNOSIS — N6324 Unspecified lump in the left breast, lower inner quadrant: Secondary | ICD-10-CM

## 2024-12-22 ENCOUNTER — Ambulatory Visit
Admission: EM | Admit: 2024-12-22 | Discharge: 2024-12-22 | Disposition: A | Discharge status: Home / Self Care | Attending: Emergency Medicine | Admitting: Emergency Medicine

## 2024-12-22 ENCOUNTER — Encounter: Payer: Self-pay | Admitting: Emergency Medicine

## 2024-12-22 DIAGNOSIS — M542 Cervicalgia: Secondary | ICD-10-CM | POA: Diagnosis not present

## 2024-12-22 DIAGNOSIS — M25511 Pain in right shoulder: Secondary | ICD-10-CM | POA: Diagnosis not present

## 2024-12-22 MED ORDER — PREDNISONE 10 MG (21) PO TBPK: ORAL_TABLET | Freq: Every day | ORAL | 0 refills | Status: AC

## 2024-12-22 MED ORDER — KETOROLAC TROMETHAMINE 30 MG/ML IJ SOLN
30.0000 mg | Freq: Once | INTRAMUSCULAR | Status: AC
  Administered 2024-12-22: 30 mg via INTRAMUSCULAR

## 2024-12-22 MED ORDER — CYCLOBENZAPRINE HCL 10 MG PO TABS: 10.0000 mg | ORAL_TABLET | Freq: Two times a day (BID) | ORAL | 0 refills | Status: AC | PRN

## 2024-12-22 NOTE — ED Triage Notes (Signed)
 Patient reports right shoulder and neck pain x 3 days. Patient denies injury.  Patient has not taken anything for symptoms. Rates pain 7/10.

## 2024-12-22 NOTE — ED Provider Notes (Signed)
 " Timothy Salas    CSN: [id number] Arrival date & time: 12/22/24  1129      History   Chief Complaint Chief Complaint  Patient presents with   Shoulder Pain   Torticollis    HPI Timothy Salas. is a 53 y.o. male.   Patient presents for evaluation of neck and shoulder pain beginning 3 days ago.  Present to the right side of the neck radiating into the lateral and posterior shoulder and anterior chest wall.  Pain is not throbbing progressively worsening overnight interfering with sleep.  And to complete full range of motion of the neck but pain is elicited with movement.  Unable to lift arm above head due to discomfort, requiring assistance for dressing based today.  I have attempted use of Tylenol  which has been ineffective.  Denies injury however is a caregiver for paraplegic requiring lifting.  Denies numbness or tingling.  Past Medical History:  Diagnosis Date   Anxiety    Common bile duct (CBD) obstruction (HCC) 2025   Complication of anesthesia    patient reports that he stopped breathing during procedure on 07/15/24.   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 Authorizing Provider  BIKTARVY  50-200-25 MG TABS tablet Take 1 tablet by mouth in the morning.    [provider]  Imiquimod Pump 3.75 % CREA Apply 1 Application topically every other day.    [provider]  omeprazole (PRILOSEC) 20 MG capsule Take 20 mg by mouth daily before breakfast.    [provider]  oxyCODONE -acetaminophen  (PERCOCET) 5-325 MG tablet Take 1 tablet by mouth every 8 (eight) hours as needed for severe pain (pain score 7-10). 08/21/24 08/21/25  Tye Millet, DO    Family History Family History  Problem Relation Age of Onset   Heart disease Father    Diabetes Maternal Grandmother    Diabetes Paternal Grandmother    Heart disease Paternal Grandfather     Social History Social History[1]   Allergies   Amoxicillin   Review of Systems Review of Systems   Physical Exam Triage Vital Signs ED Triage Vitals  Encounter Vitals Group     BP 12/22/24 1403 134/71     Girls Systolic BP Percentile --      Girls Diastolic BP Percentile --      Boys Systolic BP Percentile --      Boys Diastolic BP  Percentile --      Pulse Rate 12/22/24 1403 70     Resp 12/22/24 1403 20     Temp 12/22/24 1403 98.1 F (36.7 C)     Temp Source 12/22/24 1403 Oral     SpO2 12/22/24 1403 97 %     Weight --      Height --      Head Circumference --      Peak Flow --      Pain Score 12/22/24 1406 7     Pain Loc --      Pain Education --      Exclude from Growth Chart --    No data found.  Updated Vital Signs BP 134/71 (BP Location: Left Arm)   Pulse 70   Temp 98.1 F (36.7 C) (Oral)   Resp 20   SpO2 97%   Visual Acuity Right Eye Distance:   Left Eye Distance:   Bilateral Distance:     Right Eye Near:   Left Eye Near:    Bilateral Near:     Physical Exam Constitutional:      Appearance: Normal appearance.  Eyes:     Extraocular Movements: Extraocular movements intact.  Neck:     Comments: Tenderness present to the right lateral aspect of the neck without spinal tenderness, able to complete full range of motion pain elicited with right lateral turn as well as neck extension, 2+ carotid pulses bilaterally, no rigidity or crepitus noted  Tenderness present throughout the shoulder without point tenderness ecchymosis or deformity, limited range of motion unable to fully abduct the arm, 2+ brachial pulse, strength 4 out of 5  Pulmonary:     Effort: Pulmonary effort is normal.  Neurological:     Mental Status: He is alert and oriented to person, place, and time. Mental status is at baseline.      UC Treatments / Results  Labs (all labs ordered are listed, but only abnormal results are displayed) Labs Reviewed - No data to display  EKG   Radiology No results found.  Procedures Procedures (including critical care time)  Medications Ordered in UC Medications - No data to display  Initial Impression / Assessment and Plan / UC Course  I have reviewed the triage vital signs and the nursing notes.  Pertinent labs & imaging results that were available during my care of the patient were reviewed by me and considered in my medical decision making (see chart for details).   Neck pain, acute right shoulder pain  Etiology most likely muscular low suspicion for bone involvement, denying injury, deferring imaging, Toradol  IM given and prescribed prednisone and Flexeril  for home use and discussed administration, recommended supportive care through [facility], heat massage stretching and activity as tolerated, stretches provided for home use, recommended PCP or orthopedic follow-up for any persisting or worsening symptoms Final Clinical Impressions(s) / UC Diagnoses   Final  diagnoses:  None   Discharge Instructions   None    ED Prescriptions   None    PDMP not reviewed this encounter.     [1]  Social History Tobacco Use   Smoking status: Every Day    Current packs/day: 0.50    Types: Cigarettes   Smokeless tobacco: Never   Tobacco comments:    e-cigarettes  Vaping Use   Vaping status: Never Used  Substance Use Topics   Alcohol use: Not Currently    Comment: occassionally   Drug use: Yes    Types: Marijuana  Comment: daily use     Teresa Shelba SAUNDERS, NP 12/22/24 1444  "

## 2024-12-22 NOTE — Discharge Instructions (Addendum)
 Your pain is most likely caused by irritation to the muscles   You have been given an injection of Toradol  to help with inflammation or pain, ideally will help reduce in pain within 30 minutes  Starting tomorrow to prednisone every morning with food to reduce inflammation and help with pain, avoid ibuprofen  while taking but may use Tylenol  or any topical medicines  May use muscle relaxant twice daily as needed for additional comfort but please be mindful this make you feel sleepy  You may use heating pad in 15 minute intervals as needed for additional comfort  Begin stretching affected area daily for 10 minutes as tolerated to further loosen muscles, stretches within the packet  When sitting and lying down place pillow underneath and between knees for support  Can try sleeping without pillow on firm mattress as this keeps the spine within alignment  Practice good posture: head back, shoulders back, chest forward, pelvis back and weight distributed evenly on both legs  If pain persist after recommended treatment or reoccurs if may be beneficial to follow up with orthopedic specialist for evaluation, this doctor specializes in the bones and can manage your symptoms long-term with options such as but not limited to imaging, medications or physical therapy

## 2025-01-13 ENCOUNTER — Ambulatory Visit: Attending: Orthopedic Surgery

## 2025-01-13 DIAGNOSIS — M25512 Pain in left shoulder: Secondary | ICD-10-CM | POA: Diagnosis present

## 2025-01-13 DIAGNOSIS — M25612 Stiffness of left shoulder, not elsewhere classified: Secondary | ICD-10-CM | POA: Diagnosis present

## 2025-01-13 DIAGNOSIS — M25511 Pain in right shoulder: Secondary | ICD-10-CM | POA: Diagnosis present

## 2025-01-13 DIAGNOSIS — M542 Cervicalgia: Secondary | ICD-10-CM | POA: Diagnosis present

## 2025-01-13 DIAGNOSIS — R29898 Other symptoms and signs involving the musculoskeletal system: Secondary | ICD-10-CM | POA: Insufficient documentation

## 2025-01-13 DIAGNOSIS — M25611 Stiffness of right shoulder, not elsewhere classified: Secondary | ICD-10-CM | POA: Diagnosis present

## 2025-01-13 NOTE — Therapy (Signed)
 " OUTPATIENT PHYSICAL THERAPY CERVICAL EVALUATION   Patient Name: Timothy Salas. MRN: [id number] DOB:[birth year], 54 y.o., male Today's Date: 01/13/2025  END OF SESSION:  PT End of Session - 01/13/25 1706     Visit Number 1    Number of Visits 17    Date for Recertification  03/10/25    Authorization Type Auth required    PT Start Time 1430    PT Stop Time 1515    PT Time Calculation (min) 45 min         Past Medical History:  Diagnosis Date   Anxiety    Common bile duct (CBD) obstruction (HCC) 2025   Complication of anesthesia    patient reports that he stopped breathing during procedure on 07/15/24.   GERD (gastroesophageal reflux disease)    Hepatic steatosis    History of kidney stones    HIV (human immunodeficiency virus infection) (HCC)    Pneumonia    Past Surgical History:  Procedure Laterality Date   ERCP N/A 07/15/2024   Procedure: ERCP, WITH INTERVENTION IF INDICATED;  Surgeon: Jinny Carmine, MD;  Location: [facility] ENDOSCOPY;  Service: Endoscopy;  Laterality: N/A;   ESOPHAGOGASTRODUODENOSCOPY  07/15/2024   Procedure: EGD (ESOPHAGOGASTRODUODENOSCOPY);  Surgeon: Jinny Carmine, MD;  Location: [facility] ENDOSCOPY;  Service: Endoscopy;;   Patient Active Problem List   Diagnosis Date Noted   Nausea vomiting and diarrhea 07/20/2024   Ileus (HCC) 07/16/2024   Common bile duct (CBD) obstruction (HCC) 07/14/2024   Abdominal pain 07/14/2024   GASTROENTERITIS, ACUTE 03/05/2011   CANDIDIASIS, ORAL 02/05/2011   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   PHARYNGITIS 09/18/2010   TOBACCO USER 02/23/2010   HIV DISEASE 02/09/2010   DYSHIDROTIC ECZEMA, HANDS 02/09/2010   FACIAL RASH 02/09/2010    PCP: Osa Geralds, NP   REFERRING PROVIDER: Dow Frederic BIRCH, MD   REFERRING DIAG:  [phone] (ICD-10-CM) - Radiculopathy, cervical region  M25.511 (ICD-10-CM) - Pain in right shoulder    THERAPY DIAG:  Painful cervical ROM  Decreased ROM of neck  Acute pain  of both shoulders  Decreased ROM of left shoulder  Decreased ROM of right shoulder  Rationale for Evaluation and Treatment: Rehabilitation  ONSET DATE: 12/19/24  SUBJECTIVE:                                                                                                                                                                                                         SUBJECTIVE STATEMENT:  Pt reports that he has had 2 surgeries over the past year and being laid  up in bed for almost 2 months, he lost a lot of weight and muscle mass.  Pt went back to work following that and is lifting a quadriplegic and the pushing/pulling, etc., is likely the reasoning for the neck pain at this time.   Pt has scheduled visit with pain management in February.   Hand dominance: Ambidextrous; writes with L hand, but does everything else with the R.  PERTINENT HISTORY:   Per MD: Elsie Carlin Alexa Mickey. is a 54 y.o. male.    Patient presents for evaluation of neck and shoulder pain beginning 3 days ago.  Present to the right side of the neck radiating into the lateral and posterior shoulder and anterior chest wall.  Pain is not throbbing progressively worsening overnight interfering with sleep.  And to complete full range of motion of the neck but pain is elicited with movement.  Unable to lift arm above head due to discomfort, requiring assistance for dressing based today.  I have attempted use of Tylenol  which has been ineffective.  Denies injury however is a caregiver for paraplegic requiring lifting.  Denies numbness or tingling.  PAIN:  Are you having pain? Yes: NPRS scale: 4/10 currently; 8/10 at worst Pain location: across both shoulders in upper back and also across lower back. Pain description: achy Aggravating factors: raising arms, repetitive motions like cooking and driving manual speed [place]. Relieving factors: heat, medications sometimes, change in sleeping positions  PRECAUTIONS:  None  RED FLAGS: None     WEIGHT BEARING RESTRICTIONS: No  FALLS:  Has patient fallen in last 6 months? 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    PLOF: Independent  PATIENT GOALS: Pt wants to feel better.  NEXT MD VISIT: PCP: 3 weeks away; Pain Management: 02/04/25.  OBJECTIVE:  Note: Objective measures were completed at Evaluation unless otherwise noted.  DIAGNOSTIC FINDINGS:  MRI of Cervical Spine w/o Contrast from [facility]  C4-C5 leftward facet degenerative change and leftward uncovertebral joint hypertrophic change with moderate-2-severe left foraminal stenosis C5-C6 rightward eccentric disc protrusion with right ventral thecal sac effacement and mild right foraminal stenosis. C3-C4 leftward uncovertebral joint hypertrophic change with mild left foraminal stenosis. C6-C7 shallow central and slightly eccentric leftward disc protrusion with mild left foraminal stenosis   PATIENT SURVEYS:  Modified Oswestry:  MODIFIED OSWESTRY DISABILITY SCALE  Date: 01/13/2025 Score  Pain intensity 2 =  Pain medication provides me with complete relief from pain.  2. Personal care (washing, dressing, etc.) 1 =  I can take care of myself normally, but it increases my pain.  3. Lifting 3 = Pain prevents me from lifting heavy weights, but I can manage light to medium weights if they are conveniently positioned  4. Walking 1 = Pain prevents me from walking more than 1 mile.  5. Sitting 2 =  Pain prevents me from sitting more than 1 hour.  6. Standing 1 =  I can stand as long as I want but, it increases my pain.  7. Sleeping 2 =  Even when I take pain medication, I sleep less than 6 hours  8. Social Life 2 = Pain prevents me from participating in more  energetic activities (eg. sports, dancing).  9. Traveling 2 =  My pain restricts my  travel over 2 hours.  10. Employment/ Homemaking 2 = I can perform most of my homemaking/job duties, but pain prevents me from performing more physically stressful activities (eg, lifting, vacuuming).  Total 18/50   Interpretation of scores: Score Category Description  0-20% Minimal Disability The patient can cope with most living activities. Usually no treatment is indicated apart from advice on lifting, sitting and exercise  21-40% Moderate Disability The patient experiences more pain and difficulty with sitting, lifting and standing. Travel and social life are more difficult and they may be disabled from work. Personal care, sexual activity and sleeping are not grossly affected, and the patient can usually be managed by conservative means  41-60% Severe Disability Pain remains the main problem in this group, but activities of daily living are affected. These patients require a detailed investigation  61-80% Crippled Back pain impinges on all aspects of the patients life. Positive intervention is required  81-100% Bed-bound These patients are either bed-bound or exaggerating their symptoms  Bluford FORBES Zoe DELENA Karon DELENA, et al. Surgery versus conservative management of stable thoracolumbar fracture: the PRESTO feasibility RCT. [place] ([place]): [employer]; 2021 Nov. [employer] [facility] [employer] [facility] Technology Assessment, No. 25.62.) Appendix 3, Oswestry Disability Index category descriptors. Available from: Findjewelers.cz  Minimally Clinically Important Difference (MCID) = 12.8% NDI:  NECK DISABILITY INDEX  Date: 01/13/2025 Score  Pain intensity 2 = The pain is moderate at the moment  2. Personal care (washing, dressing, etc.) 1 =  I can look after myself normally but it causes extra pain  3. Lifting 3 = Pain prevents me from lifting heavy weights but I can manage light to medium   weights if  they are conveniently positioned  4. Reading 3 = I can't read as much as I want because of moderate pain in my neck  5. Headaches 1 =  I have slight headaches, which come infrequently  6. Concentration 0 =  I can concentrate fully when I want to with no difficulty  7. Work 2 = I can do most of my usual work, but no more  8. Driving 2 =  I can drive my car as long as I want with moderate pain in my neck  9. Sleeping 1 = My sleep is slightly disturbed (less than 1 hr sleepless)  10. Recreation 4 =  I can hardly do any recreation activities because of pain in my neck  Total 19/50   Minimum Detectable Change (90% confidence): 5 points or 10% points  COGNITION: Overall cognitive status: Within functional limits for tasks assessed  SENSATION: WFL  POSTURE: rounded shoulders, forward head, and flexed trunk   PALPATION: Pt noted to have significant pain in the cervical spine region with palpation, along with noticeably more tightness on the L side compared to the R in regards to the paraspinals.  Pt also noted to have some residual pain in the UT's as well.    CERVICAL ROM:   Active ROM A/PROM (deg) eval  Flexion 28  Extension 27  Right lateral flexion 26  Left lateral flexion 25  Right rotation 50  Left rotation 31   (Blank rows = not tested)  UPPER EXTREMITY ROM:  Active ROM Right eval Left eval  Shoulder flexion 151 151  Shoulder abduction 131 129   (Blank rows = not tested)  UPPER EXTREMITY MMT:  MMT Right eval Left eval  Shoulder flexion 4 4  Shoulder abduction 4 4  Grip strength 60# 60#   (Blank rows =  not tested)  CERVICAL SPECIAL TESTS:  Sharp pursor's test: Negative    TREATMENT DATE: 01/13/2025                                                                                                                               Evaluation   Self-Care/Home Management:  Pt given education on current POC, the findings of the evaluation, and the ways in which  skilled therapy can address the current deficits in cervical ROM, shoulder ROM, and musculature strength.  Pt instructed on how this can improve their overall function and maintain/improve their overall quality of life.      PATIENT EDUCATION:  Education details: Pt educated on role of PT and services provided during current POC, along with prognosis and information about the clinic. Person educated: Patient Education method: Explanation Education comprehension: verbalized understanding  HOME EXERCISE PROGRAM: To be given at subsequent session  ASSESSMENT:  CLINICAL IMPRESSION: Patient is a 54 y.o. male who was seen today for physical therapy evaluation and treatment for cervical radiculopathy radiating into the R shoulder, coupled with B shoulder pain.   Patient demonstrates reduced range of motion in bilateral shoulders, decreased strength in bilateral upper extremities, reduced cervical range of motion, and pain in those joints.  Cervical pain does seem to have some implication in regards to the shoulder pain that he is currently having, and will continue to be monitored in subsequent sessions.  Patient ultimately limited in his ability to perform job duties in regards to working and lifting a quadriplegic patient of his.  Patient will ultimately benefit from skilled therapy to address range of motion, strength deficits, and overall pain reported by the patient.  OBJECTIVE IMPAIRMENTS: decreased ROM, decreased strength, hypomobility, impaired UE functional use, and pain.   ACTIVITY LIMITATIONS: carrying, lifting, reach over head, and hygiene/grooming  PARTICIPATION LIMITATIONS: meal prep, cleaning, laundry, driving, shopping, community activity, occupation, and yard work  PERSONAL FACTORS: Age, Past/current experiences, Profession, Time since onset of injury/illness/exacerbation, and 1 comorbidity: anxiety, are also affecting patient's functional outcome.   REHAB POTENTIAL:  Good  CLINICAL DECISION MAKING: Stable/uncomplicated  EVALUATION COMPLEXITY: Low   GOALS: Goals reviewed with patient? Yes  SHORT TERM GOALS: Target date: 02/10/2025  Pt will be independent with HEP in order to demonstrate increased ability to perform tasks related to occupation/hobbies. Baseline: to be given at next visit Goal status: INITIAL  LONG TERM GOALS: Target date: 03/10/2025  Pt will reduce worst pain level to 0/10 by utilizing a combination of stretching, strengthening exercises, and pain-reducing modalities in order to improve overall QoL. Baseline: Worst pain: 8/10 Goal status: INITIAL  2.  Patient will increase cervical left rotation AROM to at least 50 to improve ability to safely check blind spots while driving and perform daily activities without compensatory trunk movement. Baseline: 31 deg Goal status: INITIAL  3.  Patient will increase bilateral shoulder abduction AROM to at least 150 to improve ability to  perform overhead reaching tasks and upper-body dressing independently and without pain. Baseline: R: 131 deg; L 129 deg Goal status: INITIAL  4.  Patient will increase bilateral shoulder flexion and abduction strength to 5/5, as evidenced by manual muscle testing, to improve ability to perform overhead lifting, carrying, and sustained upper-extremity tasks during daily activities without fatigue or compensation. Baseline: 4/5 for each shoulder Goal status: INITIAL    PLAN:  PT FREQUENCY: 2x/week  PT DURATION: 8 weeks  PLANNED INTERVENTIONS: 97750- Physical Performance Testing, 97110-Therapeutic exercises, 97530- Therapeutic activity, 97112- Neuromuscular re-education, 97535- Self Care, 02859- Manual therapy, 20560 (1-2 muscles), 20561 (3+ muscles)- Dry Needling, Patient/Family education, Joint mobilization, Joint manipulation, Spinal manipulation, Spinal mobilization, Cryotherapy, and Moist heat  PLAN FOR NEXT SESSION:   Establish HEP, manual  therapy for cervical region and pain management, introduce shoulder strengthening exercises.   Fonda Simpers, PT, DPT Physical Therapist - [facility]  01/13/25, 5:47 PM     "

## 2025-01-19 ENCOUNTER — Ambulatory Visit

## 2025-01-19 DIAGNOSIS — R29898 Other symptoms and signs involving the musculoskeletal system: Secondary | ICD-10-CM

## 2025-01-19 DIAGNOSIS — M25611 Stiffness of right shoulder, not elsewhere classified: Secondary | ICD-10-CM

## 2025-01-19 DIAGNOSIS — M542 Cervicalgia: Secondary | ICD-10-CM

## 2025-01-19 DIAGNOSIS — M25511 Pain in right shoulder: Secondary | ICD-10-CM

## 2025-01-19 DIAGNOSIS — M25612 Stiffness of left shoulder, not elsewhere classified: Secondary | ICD-10-CM

## 2025-01-19 NOTE — Therapy (Signed)
 " OUTPATIENT PHYSICAL THERAPY CERVICAL TREATMENT   Patient Name: Timothy Salas. MRN: [id number] DOB:[birth year], 54 y.o., male Today's Date: 01/19/2025  END OF SESSION:  PT End of Session - 01/19/25 1732     Visit Number 2    Number of Visits 17    Date for Recertification  03/10/25    Authorization Type Auth required    PT Start Time 1732    PT Stop Time 1815    PT Time Calculation (min) 43 min         Past Medical History:  Diagnosis Date   Anxiety    Common bile duct (CBD) obstruction (HCC) 2025   Complication of anesthesia    patient reports that he stopped breathing during procedure on 07/15/24.   GERD (gastroesophageal reflux disease)    Hepatic steatosis    History of kidney stones    HIV (human immunodeficiency virus infection) (HCC)    Pneumonia    Past Surgical History:  Procedure Laterality Date   ERCP N/A 07/15/2024   Procedure: ERCP, WITH INTERVENTION IF INDICATED;  Surgeon: Jinny Carmine, MD;  Location: [facility] ENDOSCOPY;  Service: Endoscopy;  Laterality: N/A;   ESOPHAGOGASTRODUODENOSCOPY  07/15/2024   Procedure: EGD (ESOPHAGOGASTRODUODENOSCOPY);  Surgeon: Jinny Carmine, MD;  Location: [facility] ENDOSCOPY;  Service: Endoscopy;;   Patient Active Problem List   Diagnosis Date Noted   Nausea vomiting and diarrhea 07/20/2024   Ileus (HCC) 07/16/2024   Common bile duct (CBD) obstruction (HCC) 07/14/2024   Abdominal pain 07/14/2024   GASTROENTERITIS, ACUTE 03/05/2011   CANDIDIASIS, ORAL 02/05/2011   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   PHARYNGITIS 09/18/2010   TOBACCO USER 02/23/2010   HIV DISEASE 02/09/2010   DYSHIDROTIC ECZEMA, HANDS 02/09/2010   FACIAL RASH 02/09/2010    PCP: Osa Geralds, NP   REFERRING PROVIDER: Dow Frederic BIRCH, MD   REFERRING DIAG:  [id number] (ICD-10-CM) - Radiculopathy, cervical region  M25.511 (ICD-10-CM) - Pain in right shoulder    THERAPY DIAG:  Painful cervical ROM  Decreased ROM of neck  Acute pain of  both shoulders  Decreased ROM of left shoulder  Decreased ROM of right shoulder  Rationale for Evaluation and Treatment: Rehabilitation  ONSET DATE: 12/19/24  SUBJECTIVE:                                                                                                                                                                                                         SUBJECTIVE STATEMENT:  Pt reports he took a meloxicam and smoked half of a joint and he's still  having 5/10 pain.  Pt notes that standing at the sink washing dishes has been really difficult for him, maybe washing 5 or so dishes, then taking a seated rest break before returning to the sink.  Hand dominance: Ambidextrous; writes with L hand, but does everything else with the R.  PERTINENT HISTORY:   Per MD: Timothy Salas. is a 54 y.o. male.    Patient presents for evaluation of neck and shoulder pain beginning 3 days ago.  Present to the right side of the neck radiating into the lateral and posterior shoulder and anterior chest wall.  Pain is not throbbing progressively worsening overnight interfering with sleep.  And to complete full range of motion of the neck but pain is elicited with movement.  Unable to lift arm above head due to discomfort, requiring assistance for dressing based today.  I have attempted use of Tylenol  which has been ineffective.  Denies injury however is a caregiver for paraplegic requiring lifting.  Denies numbness or tingling.  PAIN:  Are you having pain? Yes: NPRS scale: 4/10 currently; 8/10 at worst Pain location: across both shoulders in upper back and also across lower back. Pain description: achy Aggravating factors: raising arms, repetitive motions like cooking and driving manual speed [place]. Relieving factors: heat, medications sometimes, change in sleeping positions  PRECAUTIONS: None  RED FLAGS: None     WEIGHT BEARING RESTRICTIONS: No  FALLS:  Has patient fallen in  last 6 months? 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    PLOF: Independent  PATIENT GOALS: Pt wants to feel better.  NEXT MD VISIT: PCP: 3 weeks away; Pain Management: 02/04/25.  OBJECTIVE:  Note: Objective measures were completed at Evaluation unless otherwise noted.  DIAGNOSTIC FINDINGS:  MRI of Cervical Spine w/o Contrast from [facility]  C4-C5 leftward facet degenerative change and leftward uncovertebral joint hypertrophic change with moderate-2-severe left foraminal stenosis C5-C6 rightward eccentric disc protrusion with right ventral thecal sac effacement and mild right foraminal stenosis. C3-C4 leftward uncovertebral joint hypertrophic change with mild left foraminal stenosis. C6-C7 shallow central and slightly eccentric leftward disc protrusion with mild left foraminal stenosis   PATIENT SURVEYS:  Modified Oswestry:  MODIFIED OSWESTRY DISABILITY SCALE  Date: 01/13/2025 Score  Pain intensity 2 =  Pain medication provides me with complete relief from pain.  2. Personal care (washing, dressing, etc.) 1 =  I can take care of myself normally, but it increases my pain.  3. Lifting 3 = Pain prevents me from lifting heavy weights, but I can manage light to medium weights if they are conveniently positioned  4. Walking 1 = Pain prevents me from walking more than 1 mile.  5. Sitting 2 =  Pain prevents me from sitting more than 1 hour.  6. Standing 1 =  I can stand as long as I want but, it increases my pain.  7. Sleeping 2 =  Even when I take pain medication, I sleep less than 6 hours  8. Social Life 2 = Pain prevents me from participating in more energetic activities (eg. sports, dancing).  9. Traveling 2 =  My pain restricts my travel over 2 hours.   10. Employment/ Homemaking 2 = I can perform most of my homemaking/job duties, but pain  prevents me from performing more physically stressful activities (eg, lifting, vacuuming).  Total 18/50   Interpretation of scores: Score Category Description  0-20% Minimal Disability The patient can cope with most living activities. Usually no treatment is indicated apart from advice on lifting, sitting and exercise  21-40% Moderate Disability The patient experiences more pain and difficulty with sitting, lifting and standing. Travel and social life are more difficult and they may be disabled from work. Personal care, sexual activity and sleeping are not grossly affected, and the patient can usually be managed by conservative means  41-60% Severe Disability Pain remains the main problem in this group, but activities of daily living are affected. These patients require a detailed investigation  61-80% Crippled Back pain impinges on all aspects of the patients life. Positive intervention is required  81-100% Bed-bound These patients are either bed-bound or exaggerating their symptoms  Bluford FORBES Zoe DELENA Karon DELENA, et al. Surgery versus conservative management of stable thoracolumbar fracture: the PRESTO feasibility RCT. [place] ([place]): [employer]; 2021 Nov. [facility] Technology Assessment, No. 25.62.) Appendix 3, Oswestry Disability Index category descriptors. Available from: [email or web address].cz  Minimally Clinically Important Difference (MCID) = 12.8% NDI:  NECK DISABILITY INDEX  Date: 01/13/2025 Score  Pain intensity 2 = The pain is moderate at the moment  2. Personal care (washing, dressing, etc.) 1 =  I can look after myself normally but it causes extra pain  3. Lifting 3 = Pain prevents me from lifting heavy weights but I can manage light to medium   weights if they are conveniently positioned  4. Reading 3 = I can't read as much as I want because of moderate pain  in my neck  5. Headaches 1 =  I have slight headaches, which come infrequently  6. Concentration 0 =  I can concentrate fully when I want to with no difficulty  7. Work 2 = I can do most of my usual work, but no more  8. Driving 2 =  I can drive my car as long as I want with moderate pain in my neck  9. Sleeping 1 = My sleep is slightly disturbed (less than 1 hr sleepless)  10. Recreation 4 =  I can hardly do any recreation activities because of pain in my neck  Total 19/50   Minimum Detectable Change (90% confidence): 5 points or 10% points  COGNITION: Overall cognitive status: Within functional limits for tasks assessed  SENSATION: WFL  POSTURE: rounded shoulders, forward head, and flexed trunk   PALPATION: Pt noted to have significant pain in the cervical spine region with palpation, along with noticeably more tightness on the L side compared to the R in regards to the paraspinals.  Pt also noted to have some residual pain in the UT's as well.    CERVICAL ROM:   Active ROM A/PROM (deg) eval  Flexion 28  Extension 27  Right lateral flexion 26  Left lateral flexion 25  Right rotation 50  Left rotation 31   (Blank rows = not tested)  UPPER EXTREMITY ROM:  Active ROM Right eval Left eval  Shoulder flexion 151 151  Shoulder abduction 131 129   (Blank rows = not tested)  UPPER EXTREMITY MMT:  MMT Right eval Left eval  Shoulder flexion 4 4  Shoulder abduction 4 4  Grip strength 60# 60#   (Blank rows = not tested)  CERVICAL SPECIAL TESTS:  Sharp pursor's test: Negative    TREATMENT DATE: 01/19/2025  Manual:  Supine STM to cervical region to increase extensibility of the paraspinals Supine suboccipital release technique to decrease cervicalgia Supine manual traction performed in order to increase joint space in cervical region for pain  relief Supine cervical upglides/downglides, 30 sec bouts Supine UT/Levator stretch, 30 sec bouts to increase tissue extensibility of the cervical region   TherEx:  Standing scapular retractions with blueTB, 2x10, with heavy verbal cueing and visual demonstration for proper form Standing shoulder extensions with blueTB, 2x10, with heavy verbal cueing and visual demonstration for proper form   TherAct:  Standing wall angels, 2x10   Standing serratus wall slides, with verbal cues for proper form, 2x10    PATIENT EDUCATION:  Education details: Pt educated on role of PT and services provided during current POC, along with prognosis and information about the clinic. Person educated: Patient Education method: Explanation Education comprehension: verbalized understanding  HOME EXERCISE PROGRAM: To be given at subsequent session  ASSESSMENT:  CLINICAL IMPRESSION:  Patient responded well to the exercises and was able to perform without significant increase in pain.  Patient still has weakness and tightness that is preventing full upper extremity range of motion when performing the exercises listed above.  Patient continues to have significant trigger points noted in the upper traps that is causing majority of the pain that he is experiencing currently.  Will continue to monitor symptom response with exercises and update HEP as therapy progresses.  Pt will continue to benefit from skilled therapy to address remaining deficits in order to improve overall QoL and return to PLOF.     OBJECTIVE IMPAIRMENTS: decreased ROM, decreased strength, hypomobility, impaired UE functional use, and pain.   ACTIVITY LIMITATIONS: carrying, lifting, reach over head, and hygiene/grooming  PARTICIPATION LIMITATIONS: meal prep, cleaning, laundry, driving, shopping, community activity, occupation, and yard work  PERSONAL FACTORS: Age, Past/current experiences, Profession, Time since onset of  injury/illness/exacerbation, and 1 comorbidity: anxiety, are also affecting patient's functional outcome.   REHAB POTENTIAL: Good  CLINICAL DECISION MAKING: Stable/uncomplicated  EVALUATION COMPLEXITY: Low   GOALS: Goals reviewed with patient? Yes  SHORT TERM GOALS: Target date: 02/10/2025  Pt will be independent with HEP in order to demonstrate increased ability to perform tasks related to occupation/hobbies. Baseline: to be given at next visit Goal status: INITIAL  LONG TERM GOALS: Target date: 03/10/2025  Pt will reduce worst pain level to 0/10 by utilizing a combination of stretching, strengthening exercises, and pain-reducing modalities in order to improve overall QoL. Baseline: Worst pain: 8/10 Goal status: INITIAL  2.  Patient will increase cervical left rotation AROM to at least 50 to improve ability to safely check blind spots while driving and perform daily activities without compensatory trunk movement. Baseline: 31 deg Goal status: INITIAL  3.  Patient will increase bilateral shoulder abduction AROM to at least 150 to improve ability to perform overhead reaching tasks and upper-body dressing independently and without pain. Baseline: R: 131 deg; L 129 deg Goal status: INITIAL  4.  Patient will increase bilateral shoulder flexion and abduction strength to 5/5, as evidenced by manual muscle testing, to improve ability to perform overhead lifting, carrying, and sustained upper-extremity tasks during daily activities without fatigue or compensation. Baseline: 4/5 for each shoulder Goal status: INITIAL    PLAN:  PT FREQUENCY: 2x/week  PT DURATION: 8 weeks  PLANNED INTERVENTIONS: 97750- Physical Performance Testing, 97110-Therapeutic exercises, 97530- Therapeutic activity, [id number]- Neuromuscular re-education, 97535- Self Care, [id number]- Manual therapy, 20560 (1-2 muscles), 20561 (3+ muscles)- Dry Needling, Patient/Family  education, Joint mobilization, Joint manipulation,  Spinal manipulation, Spinal mobilization, Cryotherapy, and Moist heat  PLAN FOR NEXT SESSION:   Establish full HEP and print out for pt to perform.  Consider adding in isometric exercises, along with scapular stabilization exercises.    Fonda Simpers, PT, DPT Physical Therapist - [facility]  01/19/25, 6:50 PM     "

## 2025-01-26 ENCOUNTER — Ambulatory Visit

## 2025-01-26 DIAGNOSIS — R29898 Other symptoms and signs involving the musculoskeletal system: Secondary | ICD-10-CM

## 2025-01-26 DIAGNOSIS — M25511 Pain in right shoulder: Secondary | ICD-10-CM

## 2025-01-26 DIAGNOSIS — M25612 Stiffness of left shoulder, not elsewhere classified: Secondary | ICD-10-CM

## 2025-01-26 DIAGNOSIS — M542 Cervicalgia: Secondary | ICD-10-CM

## 2025-01-26 DIAGNOSIS — M25611 Stiffness of right shoulder, not elsewhere classified: Secondary | ICD-10-CM

## 2025-01-28 ENCOUNTER — Ambulatory Visit

## 2025-02-02 ENCOUNTER — Ambulatory Visit

## 2025-02-04 ENCOUNTER — Ambulatory Visit

## 2025-02-09 ENCOUNTER — Ambulatory Visit

## 2025-02-11 ENCOUNTER — Ambulatory Visit

## 2025-02-16 ENCOUNTER — Ambulatory Visit

## 2025-02-18 ENCOUNTER — Ambulatory Visit

## 2025-02-23 ENCOUNTER — Ambulatory Visit

## 2025-02-25 ENCOUNTER — Ambulatory Visit

## 2025-03-01 ENCOUNTER — Ambulatory Visit

## 2025-03-03 ENCOUNTER — Ambulatory Visit

## 2025-03-09 ENCOUNTER — Ambulatory Visit

## 2025-03-15 ENCOUNTER — Ambulatory Visit
